# Patient Record
Sex: Female | Born: 1980
Health system: Southern US, Community
[De-identification: ages and names within clinical notes are randomized; demographics above are authoritative.]

## PROBLEM LIST (undated history)

## (undated) DIAGNOSIS — T8859XA Other complications of anesthesia, initial encounter: Secondary | ICD-10-CM

## (undated) DIAGNOSIS — Z87442 Personal history of urinary calculi: Secondary | ICD-10-CM

## (undated) DIAGNOSIS — J45909 Unspecified asthma, uncomplicated: Secondary | ICD-10-CM

## (undated) DIAGNOSIS — F4312 Post-traumatic stress disorder, chronic: Secondary | ICD-10-CM

## (undated) DIAGNOSIS — N1330 Unspecified hydronephrosis: Secondary | ICD-10-CM

## (undated) DIAGNOSIS — F329 Major depressive disorder, single episode, unspecified: Secondary | ICD-10-CM

## (undated) DIAGNOSIS — I10 Essential (primary) hypertension: Secondary | ICD-10-CM

## (undated) DIAGNOSIS — Z1379 Encounter for other screening for genetic and chromosomal anomalies: Secondary | ICD-10-CM

## (undated) DIAGNOSIS — T4145XA Adverse effect of unspecified anesthetic, initial encounter: Secondary | ICD-10-CM

## (undated) DIAGNOSIS — Z803 Family history of malignant neoplasm of breast: Secondary | ICD-10-CM

## (undated) DIAGNOSIS — F419 Anxiety disorder, unspecified: Secondary | ICD-10-CM

## (undated) DIAGNOSIS — K580 Irritable bowel syndrome with diarrhea: Secondary | ICD-10-CM

## (undated) DIAGNOSIS — D649 Anemia, unspecified: Secondary | ICD-10-CM

## (undated) DIAGNOSIS — N201 Calculus of ureter: Secondary | ICD-10-CM

## (undated) DIAGNOSIS — K219 Gastro-esophageal reflux disease without esophagitis: Secondary | ICD-10-CM

## (undated) DIAGNOSIS — N200 Calculus of kidney: Secondary | ICD-10-CM

## (undated) DIAGNOSIS — K5900 Constipation, unspecified: Secondary | ICD-10-CM

## (undated) DIAGNOSIS — T7840XA Allergy, unspecified, initial encounter: Secondary | ICD-10-CM

## (undated) HISTORY — DX: Anxiety disorder, unspecified: F41.9

## (undated) HISTORY — DX: Encounter for other screening for genetic and chromosomal anomalies: Z13.79

## (undated) HISTORY — DX: Allergy, unspecified, initial encounter: T78.40XA

## (undated) HISTORY — DX: Anemia, unspecified: D64.9

## (undated) HISTORY — PX: COLONOSCOPY: SHX174

## (undated) HISTORY — DX: Calculus of kidney: N20.0

## (undated) HISTORY — DX: Irritable bowel syndrome with diarrhea: K58.0

## (undated) HISTORY — DX: Essential (primary) hypertension: I10

## (undated) HISTORY — DX: Gastro-esophageal reflux disease without esophagitis: K21.9

## (undated) HISTORY — DX: Family history of malignant neoplasm of breast: Z80.3

---

## 2004-11-01 HISTORY — PX: LAPAROSCOPIC ENDOMETRIOSIS FULGURATION: SUR769

## 2006-04-08 ENCOUNTER — Emergency Department (HOSPITAL_COMMUNITY): Admission: EM | Admit: 2006-04-08 | Discharge: 2006-04-08 | Payer: Self-pay | Admitting: Emergency Medicine

## 2006-12-28 ENCOUNTER — Ambulatory Visit: Payer: Self-pay | Admitting: Unknown Physician Specialty

## 2007-01-05 ENCOUNTER — Ambulatory Visit: Payer: Self-pay | Admitting: Unknown Physician Specialty

## 2007-07-13 ENCOUNTER — Ambulatory Visit: Payer: Self-pay | Admitting: Nurse Practitioner

## 2010-11-01 HISTORY — PX: ESSURE TUBAL LIGATION: SUR464

## 2012-03-28 ENCOUNTER — Ambulatory Visit: Payer: Self-pay | Admitting: Nurse Practitioner

## 2013-12-02 DIAGNOSIS — Z1379 Encounter for other screening for genetic and chromosomal anomalies: Secondary | ICD-10-CM

## 2013-12-02 HISTORY — DX: Encounter for other screening for genetic and chromosomal anomalies: Z13.79

## 2014-10-17 DIAGNOSIS — E538 Deficiency of other specified B group vitamins: Secondary | ICD-10-CM | POA: Insufficient documentation

## 2015-07-18 ENCOUNTER — Encounter: Payer: Self-pay | Admitting: Psychiatry

## 2016-05-20 ENCOUNTER — Encounter: Payer: Self-pay | Admitting: Psychiatry

## 2016-05-20 ENCOUNTER — Ambulatory Visit (INDEPENDENT_AMBULATORY_CARE_PROVIDER_SITE_OTHER): Payer: BLUE CROSS/BLUE SHIELD | Admitting: Psychiatry

## 2016-05-20 VITALS — BP 129/84 | HR 83 | Temp 98.5°F | Ht 61.0 in | Wt 163.8 lb

## 2016-05-20 DIAGNOSIS — F331 Major depressive disorder, recurrent, moderate: Secondary | ICD-10-CM

## 2016-05-20 DIAGNOSIS — F4312 Post-traumatic stress disorder, chronic: Secondary | ICD-10-CM

## 2016-05-20 MED ORDER — ARIPIPRAZOLE 2 MG PO TABS: 2.0000 mg | ORAL_TABLET | Freq: Once | ORAL | Status: DC
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
## 2016-06-30 ENCOUNTER — Encounter: Payer: Self-pay | Admitting: Psychiatry

## 2016-08-23 ENCOUNTER — Encounter: Payer: Self-pay | Admitting: Psychiatry

## 2016-08-23 ENCOUNTER — Ambulatory Visit (INDEPENDENT_AMBULATORY_CARE_PROVIDER_SITE_OTHER): Payer: BLUE CROSS/BLUE SHIELD | Admitting: Psychiatry

## 2016-08-23 VITALS — BP 141/93 | HR 87 | Temp 98.4°F | Wt 179.8 lb

## 2016-08-23 DIAGNOSIS — F330 Major depressive disorder, recurrent, mild: Secondary | ICD-10-CM

## 2016-08-23 DIAGNOSIS — F4312 Post-traumatic stress disorder, chronic: Secondary | ICD-10-CM | POA: Diagnosis not present

## 2016-08-23 MED ORDER — DULOXETINE HCL 30 MG PO CPEP: 30.0000 mg | ORAL_CAPSULE | Freq: Every day | ORAL | 1 refills | Status: DC

## 2016-08-23 MED ORDER — ARIPIPRAZOLE 2 MG PO TABS: 2.0000 mg | ORAL_TABLET | Freq: Once | ORAL | 0 refills | Status: DC
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
## 2016-09-20 ENCOUNTER — Ambulatory Visit (INDEPENDENT_AMBULATORY_CARE_PROVIDER_SITE_OTHER): Payer: BLUE CROSS/BLUE SHIELD | Admitting: Psychiatry

## 2016-09-20 ENCOUNTER — Encounter: Payer: Self-pay | Admitting: Psychiatry

## 2016-09-20 VITALS — BP 110/76 | HR 78 | Wt 171.0 lb

## 2016-09-20 DIAGNOSIS — F4312 Post-traumatic stress disorder, chronic: Secondary | ICD-10-CM | POA: Diagnosis not present

## 2016-09-20 DIAGNOSIS — F331 Major depressive disorder, recurrent, moderate: Secondary | ICD-10-CM | POA: Diagnosis not present

## 2016-09-20 MED ORDER — ARIPIPRAZOLE 2 MG PO TABS
2.0000 mg | ORAL_TABLET | Freq: Once | ORAL | 0 refills | Status: DC
Start: 2016-09-20 — End: 2017-01-10

## 2016-09-20 MED ORDER — DULOXETINE HCL 30 MG PO CPEP: 30.0000 mg | ORAL_CAPSULE | Freq: Every day | ORAL | 1 refills | Status: DC
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
## 2016-09-22 ENCOUNTER — Other Ambulatory Visit: Payer: Self-pay | Admitting: Psychiatry

## 2016-12-15 ENCOUNTER — Ambulatory Visit: Payer: BLUE CROSS/BLUE SHIELD | Admitting: Psychiatry

## 2016-12-20 ENCOUNTER — Ambulatory Visit: Payer: BLUE CROSS/BLUE SHIELD | Admitting: Psychiatry

## 2017-01-10 ENCOUNTER — Ambulatory Visit: Payer: BLUE CROSS/BLUE SHIELD | Admitting: Psychiatry

## 2017-01-10 ENCOUNTER — Other Ambulatory Visit: Payer: Self-pay | Admitting: Psychiatry

## 2017-01-10 ENCOUNTER — Other Ambulatory Visit: Payer: Self-pay

## 2017-01-10 MED ORDER — DULOXETINE HCL 30 MG PO CPEP: 30.0000 mg | ORAL_CAPSULE | Freq: Every day | ORAL | 1 refills | Status: DC

## 2017-01-10 MED ORDER — ARIPIPRAZOLE 2 MG PO TABS: 2.0000 mg | ORAL_TABLET | Freq: Once | ORAL | 0 refills | Status: DC

## 2017-01-10 NOTE — Telephone Encounter (Signed)
Meds refilled x1 month.

## 2017-01-10 NOTE — Telephone Encounter (Signed)
pt called states that she canceled her appt today at 345 because of the weather.  pt got a new appt for 02-02-17.Marland Kitchen.  pt will need refills on medicaiton.

## 2017-02-02 ENCOUNTER — Ambulatory Visit (INDEPENDENT_AMBULATORY_CARE_PROVIDER_SITE_OTHER): Payer: BLUE CROSS/BLUE SHIELD | Admitting: Psychiatry

## 2017-02-02 ENCOUNTER — Encounter: Payer: Self-pay | Admitting: Psychiatry

## 2017-02-02 VITALS — BP 129/93 | HR 89 | Temp 98.8°F | Wt 165.0 lb

## 2017-02-02 DIAGNOSIS — F331 Major depressive disorder, recurrent, moderate: Secondary | ICD-10-CM | POA: Diagnosis not present

## 2017-02-02 MED ORDER — DULOXETINE HCL 30 MG PO CPEP: 30.0000 mg | ORAL_CAPSULE | Freq: Every day | ORAL | 1 refills | Status: DC

## 2017-02-02 MED ORDER — ARIPIPRAZOLE 2 MG PO TABS: 2.0000 mg | ORAL_TABLET | Freq: Once | ORAL | 1 refills | Status: DC
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
## 2017-03-16 ENCOUNTER — Encounter: Payer: Self-pay | Admitting: Obstetrics & Gynecology

## 2017-03-16 ENCOUNTER — Ambulatory Visit (INDEPENDENT_AMBULATORY_CARE_PROVIDER_SITE_OTHER): Payer: BLUE CROSS/BLUE SHIELD | Admitting: Obstetrics & Gynecology

## 2017-03-16 VITALS — BP 120/80 | HR 74 | Ht 60.0 in | Wt 172.0 lb

## 2017-03-16 DIAGNOSIS — Z124 Encounter for screening for malignant neoplasm of cervix: Secondary | ICD-10-CM | POA: Diagnosis not present

## 2017-03-16 DIAGNOSIS — Z0000 Encounter for general adult medical examination without abnormal findings: Secondary | ICD-10-CM | POA: Diagnosis not present

## 2017-03-16 MED ORDER — NORETHIN-ETH ESTRAD-FE BIPHAS 1 MG-10 MCG / 10 MCG PO TABS: 1.0000 | ORAL_TABLET | Freq: Every day | ORAL | 11 refills | Status: DC

## 2017-03-16 NOTE — Patient Instructions (Signed)
PAP every three years if normal today Labs yearly or so (with PCP)

## 2017-03-16 NOTE — Progress Notes (Signed)
HPI:      Carolyn Parker is a 36 y.o. No obstetric history on file. who LMP was Patient's last menstrual period was 02/26/2017., she presents today for her annual examination. The patient has no complaints today. The patient is sexually active. Her last pap: was normal. The patient does perform self breast exams.  There is notable family history of breast or ovarian cancer in her family.  The patient has regular exercise: yes.  The patient denies current symptoms of depression.   Pain on left from cyst at times H/o endometriosis, IBS OCP for mood/PMS control, only LoLo Estrin works  GYN History: Contraception: tubal ligation  PMHx: Past Medical History:  Diagnosis Date  . Anxiety   . Depression    Past Surgical History:  Procedure Laterality Date  . LAPAROSCOPIC ENDOMETRIOSIS FULGURATION     Family History  Problem Relation Age of Onset  . Anxiety disorder Mother   . Depression Mother   . Diabetes Mother   . Heart Problems Mother   . Hypertension Father   . Bipolar disorder Sister    Social History  Substance Use Topics  . Smoking status: Former Smoker    Types: Cigarettes    Start date: 05/20/2000    Quit date: 05/20/2010  . Smokeless tobacco: Never Used  . Alcohol use No     Comment: social     Current Outpatient Prescriptions:  .  albuterol (PROAIR HFA) 108 (90 Base) MCG/ACT inhaler, , Disp: , Rfl:  .  Cholecalciferol (VITAMIN D-1000 MAX ST) 1000 units tablet, Take by mouth., Disp: , Rfl:  .  Cyanocobalamin (RA VITAMIN B-12 TR) 1000 MCG TBCR, Take by mouth., Disp: , Rfl:  .  DULoxetine (CYMBALTA) 30 MG capsule, Take 1 capsule (30 mg total) by mouth daily., Disp: 30 capsule, Rfl: 1 .  montelukast (SINGULAIR) 10 MG tablet, Take by mouth., Disp: , Rfl:  .  norethindrone-ethinyl estradiol-iron (MICROGESTIN FE,GILDESS FE,LOESTRIN FE) 1.5-30 MG-MCG tablet, Take by mouth., Disp: , Rfl:  .  ARIPiprazole (ABILIFY) 2 MG tablet, Take 1 tablet (2 mg total) by mouth once.,  Disp: 30 tablet, Rfl: 1 .  fluticasone (FLONASE) 50 MCG/ACT nasal spray, , Disp: , Rfl: 0 .  montelukast (SINGULAIR) 10 MG tablet, Take by mouth., Disp: , Rfl:  .  Norethindrone-Ethinyl Estradiol-Fe Biphas (LO LOESTRIN FE) 1 MG-10 MCG / 10 MCG tablet, Take 1 tablet by mouth daily., Disp: 1 Package, Rfl: 11 .  predniSONE (DELTASONE) 10 MG tablet, , Disp: , Rfl: 0 Allergies: Bee venom; Iodinated diagnostic agents; Amoxicillin-pot clavulanate; and Sertraline  Review of Systems  Constitutional: Negative for chills, fever and malaise/fatigue.  HENT: Negative for congestion, sinus pain and sore throat.   Eyes: Negative for blurred vision and pain.  Respiratory: Negative for cough and wheezing.   Cardiovascular: Negative for chest pain and leg swelling.  Gastrointestinal: Negative for abdominal pain, constipation, diarrhea, heartburn, nausea and vomiting.  Genitourinary: Negative for dysuria, frequency, hematuria and urgency.  Musculoskeletal: Negative for back pain, joint pain, myalgias and neck pain.  Skin: Negative for itching and rash.  Neurological: Negative for dizziness, tremors and weakness.  Endo/Heme/Allergies: Does not bruise/bleed easily.  Psychiatric/Behavioral: Negative for depression. The patient is not nervous/anxious and does not have insomnia.     Objective: BP 120/80   Pulse 74   Ht 5' (1.524 m)   Wt 172 lb (78 kg)   LMP 02/26/2017   BMI 33.59 kg/m   [employer]  03/16/17 1025  Weight: 172 lb (78 kg)   Body mass index is 33.59 kg/m. Physical Exam  Constitutional: She is oriented to person, place, and time. She appears well-developed and well-nourished. No distress.  Genitourinary: Rectum normal, vagina normal and uterus normal. Pelvic exam was performed with patient supine. There is no rash or lesion on the right labia. There is no rash or lesion on the left labia. Vagina exhibits no lesion. No bleeding in the vagina. Right adnexum does not display mass and does  not display tenderness. Left adnexum does not display mass and does not display tenderness. Cervix does not exhibit motion tenderness, lesion, friability or polyp.   Uterus is mobile and midaxial. Uterus is not enlarged or exhibiting a mass.  HENT:  Head: Normocephalic and atraumatic. Head is without laceration.  Right Ear: Hearing normal.  Left Ear: Hearing normal.  Nose: No epistaxis.  No foreign bodies.  Mouth/Throat: Uvula is midline, oropharynx is clear and moist and mucous membranes are normal.  Eyes: Pupils are equal, round, and reactive to light.  Neck: Normal range of motion. Neck supple. No thyromegaly present.  Cardiovascular: Normal rate and regular rhythm.  Exam reveals no gallop and no friction rub.   No murmur heard. Pulmonary/Chest: Effort normal and breath sounds normal. No respiratory distress. She has no wheezes. Right breast exhibits no mass, no skin change and no tenderness. Left breast exhibits no mass, no skin change and no tenderness.  Abdominal: Soft. Bowel sounds are normal. She exhibits no distension. There is no tenderness. There is no rebound.  Musculoskeletal: Normal range of motion.  Neurological: She is alert and oriented to person, place, and time. No cranial nerve deficit.  Skin: Skin is warm and dry.  Psychiatric: She has a normal mood and affect. Judgment normal.  Vitals reviewed.   Assessment:  ANNUAL EXAM 1. Annual physical exam   2. Screening for cervical cancer      Screening Plan:            1.  Cervical Screening-  Pap smear done today  2. Breast screening- Exam annually and mammogram>40 planned   3. Colonoscopy every 10 years, Hemoccult testing - after age 48  4. Labs managed by PCP  5. Counseling for contraception: bilateral tubal ligation  Other:  1. Annual physical exam  2. Screening for cervical cancer - IGP, Aptima HPV  Cont OCP for mood/PMS control Monitor for ovarian cyst pain (usu Left sided)    F/U  Return in about  1 year (around 03/16/2018) for Annual.  [name], MD, [facility], [facility] 03/16/2017  11:02 AM

## 2017-03-21 LAB — IGP, APTIMA HPV
HPV APTIMA: NEGATIVE
PAP SMEAR COMMENT: 0

## 2017-03-22 ENCOUNTER — Encounter: Payer: Self-pay | Admitting: Obstetrics & Gynecology

## 2017-03-23 ENCOUNTER — Other Ambulatory Visit: Payer: Self-pay | Admitting: Psychiatry

## 2017-03-23 ENCOUNTER — Encounter: Payer: Self-pay | Admitting: Obstetrics and Gynecology

## 2017-04-04 ENCOUNTER — Ambulatory Visit (INDEPENDENT_AMBULATORY_CARE_PROVIDER_SITE_OTHER): Payer: BLUE CROSS/BLUE SHIELD | Admitting: Psychiatry

## 2017-04-04 ENCOUNTER — Encounter: Payer: Self-pay | Admitting: Psychiatry

## 2017-04-04 VITALS — BP 120/84 | HR 94 | Temp 98.8°F | Wt 175.4 lb

## 2017-04-04 DIAGNOSIS — F330 Major depressive disorder, recurrent, mild: Secondary | ICD-10-CM

## 2017-04-04 MED ORDER — ARIPIPRAZOLE 2 MG PO TABS: 2.0000 mg | ORAL_TABLET | Freq: Once | ORAL | 2 refills | Status: DC

## 2017-04-04 MED ORDER — DULOXETINE HCL 30 MG PO CPEP: 30.0000 mg | ORAL_CAPSULE | Freq: Every day | ORAL | 2 refills | Status: DC
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
## 2017-05-09 ENCOUNTER — Telehealth: Payer: Self-pay | Admitting: Obstetrics & Gynecology

## 2017-05-09 NOTE — Telephone Encounter (Signed)
I will start the PA today and let pt know

## 2017-05-09 NOTE — Telephone Encounter (Signed)
Left message for pt to call back, [facility] that she did not tolerate

## 2017-05-09 NOTE — Telephone Encounter (Signed)
Patient came by office today- wanting to speak to someone about getting prior auth for medication Lo Loestren FE so she can afford to get the medication.

## 2017-05-10 NOTE — Telephone Encounter (Signed)
Called pt. No answer °

## 2017-06-27 ENCOUNTER — Ambulatory Visit: Payer: BLUE CROSS/BLUE SHIELD | Admitting: Psychiatry

## 2017-06-27 ENCOUNTER — Ambulatory Visit (INDEPENDENT_AMBULATORY_CARE_PROVIDER_SITE_OTHER): Payer: BLUE CROSS/BLUE SHIELD | Admitting: Psychiatry

## 2017-06-27 ENCOUNTER — Encounter: Payer: Self-pay | Admitting: Psychiatry

## 2017-06-27 VITALS — BP 133/87 | HR 84 | Temp 98.9°F | Wt 178.2 lb

## 2017-06-27 DIAGNOSIS — F330 Major depressive disorder, recurrent, mild: Secondary | ICD-10-CM | POA: Diagnosis not present

## 2017-06-27 MED ORDER — ARIPIPRAZOLE 2 MG PO TABS: 2.0000 mg | ORAL_TABLET | Freq: Once | ORAL | 2 refills | Status: DC

## 2017-06-27 MED ORDER — DULOXETINE HCL 30 MG PO CPEP: 30.0000 mg | ORAL_CAPSULE | Freq: Every day | ORAL | 2 refills | Status: DC
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
## 2017-09-12 ENCOUNTER — Ambulatory Visit: Payer: BLUE CROSS/BLUE SHIELD | Admitting: Psychiatry

## 2017-09-12 ENCOUNTER — Encounter: Payer: Self-pay | Admitting: Psychiatry

## 2017-09-12 VITALS — BP 132/90 | HR 77 | Ht 60.0 in | Wt 167.0 lb

## 2017-09-12 DIAGNOSIS — F331 Major depressive disorder, recurrent, moderate: Secondary | ICD-10-CM

## 2017-09-12 DIAGNOSIS — F4312 Post-traumatic stress disorder, chronic: Secondary | ICD-10-CM | POA: Diagnosis not present

## 2017-09-12 MED ORDER — DULOXETINE HCL 30 MG PO CPEP: 30.0000 mg | ORAL_CAPSULE | Freq: Every day | ORAL | 2 refills | Status: DC

## 2017-09-12 MED ORDER — ARIPIPRAZOLE 2 MG PO TABS: 2.0000 mg | ORAL_TABLET | Freq: Once | ORAL | 2 refills | Status: DC
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
## 2017-09-30 ENCOUNTER — Encounter: Payer: Self-pay | Admitting: Gastroenterology

## 2017-09-30 ENCOUNTER — Other Ambulatory Visit (INDEPENDENT_AMBULATORY_CARE_PROVIDER_SITE_OTHER): Payer: BLUE CROSS/BLUE SHIELD

## 2017-09-30 ENCOUNTER — Ambulatory Visit: Payer: BLUE CROSS/BLUE SHIELD | Admitting: Gastroenterology

## 2017-09-30 VITALS — BP 116/80 | HR 80 | Ht 60.75 in | Wt 159.0 lb

## 2017-09-30 DIAGNOSIS — R194 Change in bowel habit: Secondary | ICD-10-CM

## 2017-09-30 DIAGNOSIS — R1084 Generalized abdominal pain: Secondary | ICD-10-CM

## 2017-09-30 DIAGNOSIS — R634 Abnormal weight loss: Secondary | ICD-10-CM | POA: Diagnosis not present

## 2017-09-30 LAB — TSH: TSH: 1.57 u[IU]/mL (ref 0.35–4.50)

## 2017-09-30 LAB — IGA: IGA: 160 mg/dL (ref 68–378)

## 2017-09-30 MED ORDER — NA SULFATE-K SULFATE-MG SULF 17.5-3.13-1.6 GM/177ML PO SOLN: ORAL | 0 refills | Status: DC

## 2017-09-30 NOTE — Progress Notes (Addendum)
09/30/2017 Carolyn Parker [id number] 01/11/1981   HISTORY OF PRESENT ILLNESS:  Carolyn Parker is a 36 year old female who is new to our practice.  She was referred here by Carolyn Geraldammy McKinney, NP, for evaluation regarding change in bowel habits, abdominal pain, and weight loss with poor appetite.  She tells me that she's had GI issues forever.  Never had previous GI evaluation, was just told that she probably had IBS.  Tells me that she has seen and allergist and is apparently allergic to several foods including wheat, beef, pork, dairy, nuts, and shellfish; apparently manifests GI symptoms.  Says that she previously always had constipation with only occasional diarrhea.  Says that she recently went through a period about 3-4 weeks ago where she was having a lot of diarrhea.  Now back to more constipation again.  Complains of abdominal cramping, mostly in the LLQ.  Says that her appetite has been terrible and she has lost 25 pounds in about 3 weeks.  Feels like her appetite is coming back again slightly over the past couple of weeks.  Complains of nausea daily when she wakes up in the morning and then throughout the day as well.  Says that she is under a lot of stress as a mom, wife, and full-time Consulting civil engineerstudent.  CBC and BMP normal.  Stool for giardia and crytosporidium were negative.   Past Medical History:  Diagnosis Date  . Anxiety   . Depression   . Diverticulitis   . Diverticulosis   . Family history of breast cancer   . Genetic testing 12/2013   My Risk neg    Past Surgical History:  Procedure Laterality Date  . LAPAROSCOPIC ENDOMETRIOSIS FULGURATION      reports that she quit smoking about 7 years ago. Her smoking use included cigarettes. She started smoking about 17 years ago. she has never used smokeless tobacco. She reports that she does not drink alcohol or use drugs. family history includes Anxiety disorder in her mother; Bipolar disorder in her sister; Breast cancer (age of onset: 530) in her  maternal aunt; Colon cancer (age of onset: 6855) in her mother; Colon cancer (age of onset: 7280) in her maternal grandmother; Depression in her mother; Diabetes in her mother; Heart Problems in her mother; Hypertension in her father. Allergies  Allergen Reactions  . Bee Venom Anaphylaxis    Uncoded Allergy. Allergen: bee sting  . Iodinated Diagnostic Agents Anaphylaxis    Uncoded Allergy. Allergen: bee sting  . Amoxicillin-Pot Clavulanate Nausea And Vomiting    Other reaction(s): Vomiting  . Sertraline Other (See Comments)    Other reaction(s): Other (See Comments) Other Reaction: Other reaction-serotonin syn Other Reaction: Other reaction-serotonin syn      Outpatient Encounter Medications as of 09/30/2017  Medication Sig  . albuterol (PROAIR HFA) 108 (90 Base) MCG/ACT inhaler   . amoxicillin-clavulanate (AUGMENTIN) 875-125 MG tablet Take 1 tablet by mouth every 12 (twelve) hours. For 10 days  . ARIPiprazole (ABILIFY) 2 MG tablet Take 1 tablet (2 mg total) once for 1 dose by mouth.  . DULoxetine (CYMBALTA) 30 MG capsule Take 1 capsule (30 mg total) daily by mouth.  . FIBER PO Take by mouth. .25-.50 of teaspoon twice a day  . Probiotic Product (PROBIOTIC PO) Take 1 capsule by mouth daily.   No facility-administered encounter medications on file as of 09/30/2017.      REVIEW OF SYSTEMS  : All other systems reviewed and negative except where noted in  the History of Present Illness.   PHYSICAL EXAM: There were no vitals taken for this visit. General: Well developed white female in no acute distress Head: Normocephalic and atraumatic Eyes:  Sclerae anicteric, conjunctiva pink. Ears: Normal auditory acuity Lungs: Clear throughout to auscultation; no increased WOB. Heart: Regular rate and rhythm; no M/R/G. Abdomen: Soft, non-distended.  BS present.  Mild diffuse TTP, somewhat > on the left. Rectal:  Will be done at the time of colonoscopy. Musculoskeletal: Symmetrical with no gross  deformities  Skin: No lesions on visible extremities Extremities: No edema  Neurological: Alert oriented x 4, grossly non-focal Psychological:  Alert and cooperative. Normal mood and affect  ASSESSMENT AND PLAN: *36 year old female with long-standing GI symptoms, no previous GI evaluation, presenting with several complaints including diarrhea alternating with constipation, abdominal pain, loss of appetite, weight loss, and nausea.  Apparently has several food "allergies" with which she manifests GI symptoms.  Will check labs including TSH and celiac studies.  Will schedule for colonoscopy with Dr. Rhea BeltonPyrtle.  Will continue daily probiotic and will switch to a daily powder fiber supplement such as Benefiber or Citrucel.  **The risks, benefits, and alternatives to colonoscopy were discussed with the patient and she consents to proceed.    CC:  Carolyn GeraldMcKinney, [name], Parker  Addendum: Reviewed and agree with initial management. Pyrtle, Carie CaddyJay M, MD

## 2017-09-30 NOTE — Patient Instructions (Signed)
If you are age 765 or older, your body mass index should be between 23-30. Your Body mass index is 30.29 kg/m. If this is out of the aforementioned range listed, please consider follow up with your Primary Care Provider.  If you are age 34 or younger, your body mass index should be between 19-25. Your Body mass index is 30.29 kg/m. If this is out of the aformentioned range listed, please consider follow up with your Primary Care Provider.   You have been scheduled for a colonoscopy. Please follow written instructions given to you at your visit today.  Please pick up your prep supplies at the pharmacy within the next 1-3 days. If you use inhalers (even only as needed), please bring them with you on the day of your procedure. Your physician has requested that you go to www.startemmi.com and enter the access code given to you at your visit today. This web site gives a general overview about your procedure. However, you should still follow specific instructions given to you by our office regarding your preparation for the procedure.  We have sent the following medications to your pharmacy for you to pick up at your convenience: Suprep  Your physician has requested that you go to the basement for the following lab work before leaving today: TSH IGA TTG  Thank you for choosing me and [facility].   Doug SouJessica Zehr, PA-C

## 2017-10-03 ENCOUNTER — Telehealth: Payer: Self-pay | Admitting: Gastroenterology

## 2017-10-03 LAB — TISSUE TRANSGLUTAMINASE ABS,IGG,IGA
(TTG) AB, IGA: 1 U/mL
(tTG) Ab, IgG: 2 U/mL

## 2017-10-03 NOTE — Telephone Encounter (Signed)
Pharmacy informed that we will give the pt a sample.

## 2017-10-03 NOTE — Telephone Encounter (Signed)
Called the patient and left a message on voicemail (home and cell) for her to return call. Will put Suprep sample at front desk for her to pick up.

## 2017-10-05 ENCOUNTER — Encounter: Payer: Self-pay | Admitting: Gastroenterology

## 2017-10-05 DIAGNOSIS — R194 Change in bowel habit: Secondary | ICD-10-CM | POA: Insufficient documentation

## 2017-10-05 DIAGNOSIS — R634 Abnormal weight loss: Secondary | ICD-10-CM | POA: Insufficient documentation

## 2017-10-05 DIAGNOSIS — R1084 Generalized abdominal pain: Secondary | ICD-10-CM | POA: Insufficient documentation

## 2017-10-06 ENCOUNTER — Other Ambulatory Visit: Payer: Self-pay

## 2017-10-06 ENCOUNTER — Encounter: Payer: Self-pay | Admitting: Internal Medicine

## 2017-10-06 ENCOUNTER — Ambulatory Visit (AMBULATORY_SURGERY_CENTER): Payer: BLUE CROSS/BLUE SHIELD | Admitting: Internal Medicine

## 2017-10-06 ENCOUNTER — Telehealth: Payer: Self-pay | Admitting: *Deleted

## 2017-10-06 VITALS — BP 140/96 | HR 89 | Temp 99.1°F | Resp 14 | Ht 63.75 in | Wt 159.0 lb

## 2017-10-06 DIAGNOSIS — R197 Diarrhea, unspecified: Secondary | ICD-10-CM | POA: Diagnosis not present

## 2017-10-06 DIAGNOSIS — R194 Change in bowel habit: Secondary | ICD-10-CM | POA: Diagnosis present

## 2017-10-06 MED ORDER — SODIUM CHLORIDE 0.9 % IV SOLN: 500.0000 mL | Freq: Once | INTRAVENOUS | Status: DC

## 2017-10-06 NOTE — Telephone Encounter (Signed)
It might still be possible for her to have procedure today, since she is scheduled for late afternoon. It would have to be like this:  Take first dose of prep now with the water afterward as instructed.  Take second dose of prep at 11AM with the water as well.  If she is unable to tolerate that or her stool is not running clear/pale yellow by the time she is supposed to leave for the [facility], call back . Dr. Rhea BeltonPyrtle will be available early afternoon.

## 2017-10-06 NOTE — Telephone Encounter (Signed)
Pt states she still wants to try to have procedure done.  She has Phenergan at home and I told her to take a Phenergan now, wait about 20 minutes and try drinking her prep again.  She will call back at noon with an update on how she is doing.

## 2017-10-06 NOTE — Telephone Encounter (Signed)
Patient called back in stating that she is throwing up the prep. Best # [phone]

## 2017-10-06 NOTE — Progress Notes (Signed)
Called to room to assist during endoscopic procedure.  Patient ID and intended procedure confirmed with present staff. Received instructions for my participation in the procedure from the performing physician.  

## 2017-10-06 NOTE — Telephone Encounter (Signed)
Returned pts phone call and gave her instructions to start and complete the prep this morning.  She knows to call back if she is unable to tolerate or if stools are not running clear.

## 2017-10-06 NOTE — Progress Notes (Signed)
Patient oxygen level dropped to the 80s o2 applied to increase level. Oxygen level increased to 96. Patient alert and talking. Skin pink. Stated I feel so much better. Dr. Rhea BeltonPyrtle notified of change in status.

## 2017-10-06 NOTE — Progress Notes (Signed)
Report given to PACU, vss 

## 2017-10-06 NOTE — Op Note (Addendum)
[facility] Patient Name: Carolyn Parker Procedure Date: 10/06/2017 3:31 PM MRN: [id number] Endoscopist: Beverley Fiedler , MD Age: 36 Referring MD:  Date of Birth: 1981-06-27 Gender: Female Account #: [id number] Procedure:                Colonoscopy Indications:              Clinically significant diarrhea of unexplained                            origin, Change in bowel habits, Weight loss Medicines:                Monitored Anesthesia Care Procedure:                Pre-Anesthesia Assessment:                           - Prior to the procedure, a History and Physical                            was performed, and patient medications and                            allergies were reviewed. The patient's tolerance of                            previous anesthesia was also reviewed. The risks                            and benefits of the procedure and the sedation                            options and risks were discussed with the patient.                            All questions were answered, and informed consent                            was obtained. Prior Anticoagulants: The patient has                            taken no previous anticoagulant or antiplatelet                            agents. ASA Grade Assessment: II - A patient with                            mild systemic disease. After reviewing the risks                            and benefits, the patient was deemed in                            satisfactory condition to undergo the procedure.  After obtaining informed consent, the colonoscope                            was passed under direct vision. Throughout the                            procedure, the patient's blood pressure, pulse, and                            oxygen saturations were monitored continuously. The                            Colonoscope was introduced through the anus and                            advanced to the the  terminal ileum. The colonoscopy                            was performed without difficulty. The patient                            tolerated the procedure well. The quality of the                            bowel preparation was excellent. The terminal                            ileum, ileocecal valve, appendiceal orifice, and                            rectum were photographed. Scope In: 3:48:34 PM Scope Out: 4:00:13 PM Scope Withdrawal Time: 0 hours 7 minutes 50 seconds  Total Procedure Duration: 0 hours 11 minutes 39 seconds  Findings:                 The digital rectal exam was normal.                           The terminal ileum appeared normal.                           The colon (entire examined portion) appeared                            normal. Biopsies for histology were taken with a                            cold forceps from the right colon and left colon                            for evaluation of microscopic colitis.                           The retroflexed view of the distal rectum and anal  verge was normal and showed no anal or rectal                            abnormalities. Complications:            No immediate complications. Estimated Blood Loss:     Estimated blood loss was minimal. Impression:               - The examined portion of the ileum was normal.                           - The entire examined colon is normal. Biopsied.                           - The distal rectum and anal verge are normal on                            retroflexion view. Recommendation:           - Patient has a contact number available for                            emergencies. The signs and symptoms of potential                            delayed complications were discussed with the                            patient. Return to normal activities tomorrow.                            Written discharge instructions were provided to the                             patient.                           - Resume previous diet.                           - Continue present medications.                           - Await pathology results. If negative would pursue                            CBC, CMP, CRP and consider trial of rifaximin 550                            mg TID x 14 days for IBS-D.                           - Repeat colonoscopy at age 36 for screening                            purposes given family history of colon  cancer. Beverley FiedlerJay M Jimmy Stipes, MD 10/06/2017 4:10:19 PM This report has been signed electronically.

## 2017-10-06 NOTE — Telephone Encounter (Signed)
Patient states she just finished first round of trip and wanted to check before starting second part. Best call back # [phone]

## 2017-10-06 NOTE — Telephone Encounter (Signed)
Pt called this am.  States that before she was to start taking prep last night she started having nausea and diarrhea.  She called on call physician and was told to take the promethazine that she had at home.  She took this and it helped but she fell asleep and did not take prep last night.  She feels like she can take the 2nd half this morning.  Please advise if she should proceed and if she should take any additional prep.

## 2017-10-06 NOTE — Telephone Encounter (Signed)
Pt states she will drink the second dose of her prep now and will be NPO after 1.

## 2017-10-06 NOTE — Patient Instructions (Signed)
Discharge instructions given. Biopsies taken. Resume previous medications. YOU HAD AN ENDOSCOPIC PROCEDURE TODAY AT THE [facility]:   Refer to the procedure report that was given to you for any specific questions about what was found during the examination.  If the procedure report does not answer your questions, please call your gastroenterologist to clarify.  If you requested that your care partner not be given the details of your procedure findings, then the procedure report has been included in a sealed envelope for you to review at your convenience later.  YOU SHOULD EXPECT: Some feelings of bloating in the abdomen. Passage of more gas than usual.  Walking can help get rid of the air that was put into your GI tract during the procedure and reduce the bloating. If you had a lower endoscopy (such as a colonoscopy or flexible sigmoidoscopy) you may notice spotting of blood in your stool or on the toilet paper. If you underwent a bowel prep for your procedure, you may not have a normal bowel movement for a few days.  Please Note:  You might notice some irritation and congestion in your nose or some drainage.  This is from the oxygen used during your procedure.  There is no need for concern and it should clear up in a day or so.  SYMPTOMS TO REPORT IMMEDIATELY:   Following lower endoscopy (colonoscopy or flexible sigmoidoscopy):  Excessive amounts of blood in the stool  Significant tenderness or worsening of abdominal pains  Swelling of the abdomen that is new, acute  Fever of 100F or higher   For urgent or emergent issues, a gastroenterologist can be reached at any hour by calling [phone].   DIET:  We do recommend a small meal at first, but then you may proceed to your regular diet.  Drink plenty of fluids but you should avoid alcoholic beverages for 24 hours.  ACTIVITY:  You should plan to take it easy for the rest of today and you should NOT DRIVE or use heavy  machinery until tomorrow (because of the sedation medicines used during the test).    FOLLOW UP: Our staff will call the number listed on your records the next business day following your procedure to check on you and address any questions or concerns that you may have regarding the information given to you following your procedure. If we do not reach you, we will leave a message.  However, if you are feeling well and you are not experiencing any problems, there is no need to return our call.  We will assume that you have returned to your regular daily activities without incident.  If any biopsies were taken you will be contacted by phone or by letter within the next 1-3 weeks.  Please call us at [phone] if you have not heard about the biopsies in 3 weeks.    SIGNATURES/CONFIDENTIALITY: You and/or your care partner have signed paperwork which will be entered into your electronic medical record.  These signatures attest to the fact that that the information above on your After Visit Summary has been reviewed and is understood.  Full responsibility of the confidentiality of this discharge information lies with you and/or your care-partner. 

## 2017-10-06 NOTE — Progress Notes (Signed)
Pt's states no medical or surgical changes since previsit or office visit. 

## 2017-10-07 ENCOUNTER — Telehealth: Payer: Self-pay | Admitting: *Deleted

## 2017-10-07 NOTE — Telephone Encounter (Signed)
  Follow up Call-  Call back number 10/06/2017  Post procedure Call Back phone  # [phone]  Permission to leave phone message Yes  Some recent data might be hidden     Patient questions:  Do you have a fever, pain , or abdominal swelling? No. Pain Score  0 *  Have you tolerated food without any problems? Yes.    Have you been able to return to your normal activities? Yes.    Do you have any questions about your discharge instructions: Diet   No. Medications  No. Follow up visit  No.  Do you have questions or concerns about your Care? No.  Actions: * If pain score is 4 or above: No action needed, pain <4.

## 2017-10-12 ENCOUNTER — Other Ambulatory Visit: Payer: Self-pay

## 2017-10-12 DIAGNOSIS — K529 Noninfective gastroenteritis and colitis, unspecified: Secondary | ICD-10-CM

## 2017-10-12 MED ORDER — RIFAXIMIN 550 MG PO TABS: 550.0000 mg | ORAL_TABLET | Freq: Three times a day (TID) | ORAL | 0 refills | Status: DC

## 2017-10-20 ENCOUNTER — Other Ambulatory Visit (INDEPENDENT_AMBULATORY_CARE_PROVIDER_SITE_OTHER): Payer: BLUE CROSS/BLUE SHIELD

## 2017-10-20 DIAGNOSIS — K529 Noninfective gastroenteritis and colitis, unspecified: Secondary | ICD-10-CM

## 2017-10-20 LAB — COMPREHENSIVE METABOLIC PANEL
ALBUMIN: 4.5 g/dL (ref 3.5–5.2)
ALT: 17 U/L (ref 0–35)
AST: 14 U/L (ref 0–37)
Alkaline Phosphatase: 68 U/L (ref 39–117)
BUN: 13 mg/dL (ref 6–23)
CALCIUM: 9.2 mg/dL (ref 8.4–10.5)
CHLORIDE: 104 meq/L (ref 96–112)
CO2: 29 meq/L (ref 19–32)
Creatinine, Ser: 0.82 mg/dL (ref 0.40–1.20)
GFR: 84.71 mL/min (ref >60.00)
Glucose, Bld: 89 mg/dL (ref 70–99)
POTASSIUM: 4 meq/L (ref 3.5–5.1)
Sodium: 141 mEq/L (ref 135–145)
Total Bilirubin: 0.4 mg/dL (ref 0.2–1.2)
Total Protein: 7.6 g/dL (ref 6.0–8.3)

## 2017-10-20 LAB — CBC WITH DIFFERENTIAL/PLATELET
BASOS PCT: 1 % (ref 0.0–3.0)
Basophils Absolute: 0.1 10*3/uL (ref 0.0–0.1)
EOS PCT: 2.9 % (ref 0.0–5.0)
Eosinophils Absolute: 0.2 10*3/uL (ref 0.0–0.7)
HEMATOCRIT: 42 % (ref 36.0–46.0)
HEMOGLOBIN: 13.5 g/dL (ref 12.0–15.0)
LYMPHS PCT: 39.9 % (ref 12.0–46.0)
Lymphs Abs: 3 10*3/uL (ref 0.7–4.0)
MCHC: 32.1 g/dL (ref 30.0–36.0)
MCV: 85.9 fl (ref 78.0–100.0)
MONOS PCT: 6 % (ref 3.0–12.0)
Monocytes Absolute: 0.5 10*3/uL (ref 0.1–1.0)
Neutro Abs: 3.8 10*3/uL (ref 1.4–7.7)
Neutrophils Relative %: 50.2 % (ref 43.0–77.0)
Platelets: 292 10*3/uL (ref 150.0–400.0)
RBC: 4.89 Mil/uL (ref 3.87–5.11)
RDW: 13.4 % (ref 11.5–15.5)
WBC: 7.6 10*3/uL (ref 4.0–10.5)

## 2017-10-20 LAB — C-REACTIVE PROTEIN: CRP: 0.8 mg/dL (ref 0.5–20.0)

## 2017-12-05 ENCOUNTER — Other Ambulatory Visit: Payer: Self-pay

## 2017-12-05 ENCOUNTER — Telehealth: Payer: Self-pay | Admitting: Internal Medicine

## 2017-12-05 MED ORDER — DIPHENOXYLATE-ATROPINE 2.5-0.025 MG PO TABS: 1.0000 | ORAL_TABLET | Freq: Four times a day (QID) | ORAL | 2 refills | Status: DC | PRN

## 2017-12-05 NOTE — Telephone Encounter (Signed)
I would recommend that she follow-up with me in the office to discuss other therapies for IBS D such as Viberzi or Lotronex While waiting on appointment she can use Lomotil 1 tablet 4 times daily as needed for diarrhea and to help control loose stools

## 2017-12-05 NOTE — Telephone Encounter (Signed)
Patient states she was put on medication xifaxan after procedure on 12.6.18 but it is not working and she wants to know what she can do.

## 2017-12-05 NOTE — Telephone Encounter (Signed)
Pt aware and scheduled to see Dr. Rhea BeltonPyrtle 02/06/18@1 :45pm. Script sent to pharmacy.

## 2017-12-05 NOTE — Telephone Encounter (Signed)
Pt had colon in December and was placed on xifaxan for 14 days. States this helped her symptoms for about a week but now she is having diarrhea and nausea now. She states last night she had diarrhea in her sleep and didn't even know she had it. Patient wants to know what else she can try. Please advise.

## 2017-12-07 ENCOUNTER — Telehealth: Payer: Self-pay | Admitting: Internal Medicine

## 2017-12-07 NOTE — Telephone Encounter (Signed)
Left voicemail advising patient that we have never prescribed promethazine for her. Therefore, she will need to get promethazine from her PCP or whomever previously prescribed her medication.

## 2017-12-12 ENCOUNTER — Encounter: Payer: Self-pay | Admitting: Psychiatry

## 2017-12-12 ENCOUNTER — Other Ambulatory Visit: Payer: Self-pay

## 2017-12-12 ENCOUNTER — Ambulatory Visit: Payer: BLUE CROSS/BLUE SHIELD | Admitting: Psychiatry

## 2017-12-12 VITALS — BP 126/92 | HR 102 | Temp 98.5°F | Wt 153.0 lb

## 2017-12-12 DIAGNOSIS — F4312 Post-traumatic stress disorder, chronic: Secondary | ICD-10-CM | POA: Diagnosis not present

## 2017-12-12 DIAGNOSIS — F331 Major depressive disorder, recurrent, moderate: Secondary | ICD-10-CM

## 2017-12-12 MED ORDER — DULOXETINE HCL 30 MG PO CPEP: 30.0000 mg | ORAL_CAPSULE | Freq: Every day | ORAL | 2 refills | Status: DC

## 2017-12-12 MED ORDER — ARIPIPRAZOLE 2 MG PO TABS: 2.0000 mg | ORAL_TABLET | Freq: Once | ORAL | 2 refills | Status: DC
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
## 2017-12-14 ENCOUNTER — Telehealth: Payer: Self-pay

## 2017-12-14 NOTE — Telephone Encounter (Signed)
Pt in a lot of lower abdominal pain on the left.  Ibuprofen, tylenol, aspirin - nothing helps c the pain.  [phone]  Spoke c hsb 4:40pm, on his way c pt to the ED, not sure if she will be able to keep appt tomorrow.  Pt saw PCP, blood in urine, they told her to go to ED.

## 2017-12-15 ENCOUNTER — Ambulatory Visit: Payer: BLUE CROSS/BLUE SHIELD | Admitting: Obstetrics & Gynecology

## 2017-12-22 ENCOUNTER — Other Ambulatory Visit: Payer: Self-pay | Admitting: Urology

## 2017-12-22 ENCOUNTER — Encounter (HOSPITAL_COMMUNITY): Payer: Self-pay | Admitting: General Practice

## 2017-12-23 ENCOUNTER — Encounter (HOSPITAL_COMMUNITY): Payer: Self-pay | Admitting: General Practice

## 2017-12-26 ENCOUNTER — Ambulatory Visit (HOSPITAL_COMMUNITY): Payer: BLUE CROSS/BLUE SHIELD

## 2017-12-26 ENCOUNTER — Encounter (HOSPITAL_COMMUNITY)
Admission: RE | Disposition: A | Discharge status: Home / Self Care | Payer: Self-pay | Source: Ambulatory Visit | Attending: Urology

## 2017-12-26 ENCOUNTER — Ambulatory Visit (HOSPITAL_COMMUNITY)
Admission: RE | Admit: 2017-12-26 | Discharge: 2017-12-26 | Disposition: A | Discharge status: Home / Self Care | Payer: BLUE CROSS/BLUE SHIELD | Source: Ambulatory Visit | Attending: Urology | Admitting: Urology

## 2017-12-26 ENCOUNTER — Encounter (HOSPITAL_COMMUNITY): Payer: Self-pay | Admitting: General Practice

## 2017-12-26 DIAGNOSIS — K219 Gastro-esophageal reflux disease without esophagitis: Secondary | ICD-10-CM | POA: Diagnosis not present

## 2017-12-26 DIAGNOSIS — Z79899 Other long term (current) drug therapy: Secondary | ICD-10-CM | POA: Insufficient documentation

## 2017-12-26 DIAGNOSIS — N201 Calculus of ureter: Secondary | ICD-10-CM | POA: Insufficient documentation

## 2017-12-26 DIAGNOSIS — F419 Anxiety disorder, unspecified: Secondary | ICD-10-CM | POA: Diagnosis not present

## 2017-12-26 DIAGNOSIS — F329 Major depressive disorder, single episode, unspecified: Secondary | ICD-10-CM | POA: Diagnosis not present

## 2017-12-26 DIAGNOSIS — J45909 Unspecified asthma, uncomplicated: Secondary | ICD-10-CM | POA: Insufficient documentation

## 2017-12-26 DIAGNOSIS — Z87891 Personal history of nicotine dependence: Secondary | ICD-10-CM | POA: Insufficient documentation

## 2017-12-26 HISTORY — DX: Personal history of urinary calculi: Z87.442

## 2017-12-26 HISTORY — PX: EXTRACORPOREAL SHOCK WAVE LITHOTRIPSY: SHX1557

## 2017-12-26 LAB — PREGNANCY, URINE: Preg Test, Ur: NEGATIVE

## 2017-12-26 SURGERY — LITHOTRIPSY, ESWL
Anesthesia: LOCAL | Laterality: Left

## 2017-12-26 MED ORDER — DIAZEPAM 5 MG PO TABS
10.0000 mg | ORAL_TABLET | ORAL | Status: AC
  Administered 2017-12-26: 10 mg via ORAL
  Filled 2017-12-26: qty 2

## 2017-12-26 MED ORDER — CIPROFLOXACIN HCL 500 MG PO TABS
500.0000 mg | ORAL_TABLET | ORAL | Status: AC
  Administered 2017-12-26: 500 mg via ORAL
  Filled 2017-12-26: qty 1

## 2017-12-26 MED ORDER — DIPHENHYDRAMINE HCL 25 MG PO CAPS
25.0000 mg | ORAL_CAPSULE | ORAL | Status: AC
  Administered 2017-12-26: 25 mg via ORAL
  Filled 2017-12-26: qty 1

## 2017-12-26 MED ORDER — SODIUM CHLORIDE 0.9 % IV SOLN
INTRAVENOUS | Status: DC
  Administered 2017-12-26: 08:00:00 via INTRAVENOUS

## 2017-12-26 NOTE — H&P (Signed)
CC: I have ureteral stone.  HPI: Carolyn Parker is a 37 year-old female patient who is here for ureteral stone.  The problem is on the left side. She first stated noticing pain on 12/14/2017. This is her first kidney stone. She is currently having flank pain and groin pain. She denies having back pain, nausea, vomiting, fever, and chills. Pain is occuring on the left side. She has not caught a stone in her urine strainer since her symptoms began.   She has never had surgical treatment for calculi in the past.   Patient developed left flank pain December 14, 2017. [facility] in the ample [place] which revealed a 4 mm left proximal stone. No other stones were commented on. Her white count was 9, creatinine 0.82. Urine culture grew enterococcus sensitive to ampicillin and nitrofurantoin fluoroquinolones. She's taking Cipro.  Repeat culture with no pathologic growth. KUB showed stable stone left proximal ureter in our office.     ALLERGIES: None   MEDICATIONS: Ketorolac Tromethamine 10 mg tablet  Aripiprazole 2 mg tablet  Ciprofloxacin Hcl 500 mg tablet  Diphenoxylate-Atropine  Duloxetine Hcl 30 mg capsule,delayed release  Oxycodone Hcl     GU PSH: None   NON-GU PSH: None   GU PMH: Endometriosis, Unspec    NON-GU PMH: Anxiety Asthma Chronic fatigue, unspecified Depression GERD Other irritable bowel syndrome    FAMILY HISTORY: 2 sons - Other Colon Cancer - Mother Hypertension - Father Kidney Stones - Mother, Father   SOCIAL HISTORY: Marital Status: Married Preferred Language: English; Race: White Current Smoking Status: Patient does not smoke anymore.   Tobacco Use Assessment Completed: Used Tobacco in last 30 days? Drinks 1 caffeinated drink per day.    REVIEW OF SYSTEMS:    GU Review Female:   Patient denies frequent urination, hard to postpone urination, burning /pain with urination, get up at night to urinate, leakage of urine, stream starts and  stops, trouble starting your stream, have to strain to urinate, and being pregnant.  Gastrointestinal (Upper):   Patient reports nausea and vomiting. Patient denies indigestion/ heartburn.  Gastrointestinal (Lower):   Patient reports diarrhea and constipation.   Constitutional:   Patient reports fever, weight loss, and fatigue. Patient denies night sweats.  Skin:   Patient denies skin rash/ lesion and itching.  Eyes:   Patient denies blurred vision and double vision.  Ears/ Nose/ Throat:   Patient denies sore throat and sinus problems.  Hematologic/Lymphatic:   Patient denies easy bruising and swollen glands.  Cardiovascular:   Patient denies leg swelling and chest pains.  Respiratory:   Patient denies cough and shortness of breath.  Endocrine:   Patient denies excessive thirst.  Musculoskeletal:   Patient reports back pain. Patient denies joint pain.  Neurological:   Patient denies headaches and dizziness.  Psychologic:   Patient denies depression and anxiety.   Notes: Blood in urine UTI Vaginal bleeding    VITAL SIGNS:      12/21/2017 09:36 AM  Weight 148 lb / 67.13 kg  Height 60 in / 152.4 cm  BP 136/80 mmHg  Heart Rate 84 /min  Temperature 98.5 F / 36.9 C  BMI 28.9 kg/m   MULTI-SYSTEM PHYSICAL EXAMINATION:    Constitutional: Well-nourished. No physical deformities. Normally developed. Good grooming.  Neck: Neck symmetrical, not swollen. Normal tracheal position.  Respiratory: No labored breathing, no use of accessory muscles.   Cardiovascular: Normal temperature, normal extremity pulses, no swelling, no varicosities.  Skin:  No paleness, no jaundice, no cyanosis. No lesion, no ulcer, no rash.  Neurologic / Psychiatric: Oriented to time, oriented to place, oriented to person. No depression, no anxiety, no agitation.  Gastrointestinal: No mass, no tenderness, no rigidity, non obese abdomen.  Eyes: Normal conjunctivae. Normal eyelids.  Ears, Nose, Mouth, and Throat: Left ear no  scars, no lesions, no masses. Right ear no scars, no lesions, no masses. Nose no scars, no lesions, no masses. Normal hearing. Normal lips.  Musculoskeletal: Normal gait and station of head and neck.     PAST DATA REVIEWED:  Source Of History:  Patient   PROCEDURES:         KUB - [id number]  A single view of the abdomen is obtained.      The bones appeared normal. The bowel gas pattern appeared normal. The soft tissues were unremarkable.         Urinalysis w/Scope Dipstick Dipstick Cont'd Micro  Color: Yellow Bilirubin: Neg WBC/hpf: 20 - 40/hpf  Appearance: Clear Ketones: 3+ RBC/hpf: >60/hpf  Specific Gravity: 1.025 Blood: 3+ Bacteria: Many (>50/hpf)  pH: 5.5 Protein: 3+ Cystals: NS (Not Seen)  Glucose: Neg Urobilinogen: 0.2 Casts: NS (Not Seen)    Nitrites: Neg Trichomonas: Not Present    Leukocyte Esterase: 1+ Mucous: Present      Epithelial Cells: 6 - 10/hpf      Yeast: NS (Not Seen)      Sperm: Not Present    Notes: TRANSITIONAL EPIS NOTED          Ketoralac 60mg  - [id number], [id number] Qty: 60 Adm. By: Tawnya Crook  Unit: mg Lot No [id number]  Route: IM Exp. Date 12/31/2018  Freq: None Mfgr.:   Site: Left Hip   ASSESSMENT:      ICD-10 Details  1 GU:   Ureteral calculus - N20.1    PLAN:            Medications New Meds: Tamsulosin Hcl 0.4 mg capsule 1 capsule PO Daily   #14  0 Refill(s)            Orders Labs Urine Culture  X-Rays: KUB          Schedule Return Visit/Planned Activity: Next Available Appointment - Schedule Surgery          Document Letter(s):  Created for Patient: Clinical Summary         Notes:   ureteral stone, left -- Left ESWL

## 2017-12-26 NOTE — H&P (View-Only) (Signed)
CC: I have ureteral stone.  HPI: Carolyn Parker is a 37 year-old female patient who is here for ureteral stone.  The problem is on the left side. She first stated noticing pain on 12/14/2017. This is her first kidney stone. She is currently having flank pain and groin pain. She denies having back pain, nausea, vomiting, fever, and chills. Pain is occuring on the left side. She has not caught a stone in her urine strainer since her symptoms began.   She has never had surgical treatment for calculi in the past.   Patient developed left flank pain December 14, 2017. [facility] in the [place] which revealed a 4 mm left proximal stone. No other stones were commented on. Her white count was 9, creatinine 0.82. Urine culture grew enterococcus sensitive to ampicillin and nitrofurantoin fluoroquinolones. She's taking Cipro.  Repeat culture with no pathologic growth. KUB showed stable stone left proximal ureter in our office.     ALLERGIES: None   MEDICATIONS: Ketorolac Tromethamine 10 mg tablet  Aripiprazole 2 mg tablet  Ciprofloxacin Hcl 500 mg tablet  Diphenoxylate-Atropine  Duloxetine Hcl 30 mg capsule,delayed release  Oxycodone Hcl     GU PSH: None   NON-GU PSH: None   GU PMH: Endometriosis, Unspec    NON-GU PMH: Anxiety Asthma Chronic fatigue, unspecified Depression GERD Other irritable bowel syndrome    FAMILY HISTORY: 2 sons - Other Colon Cancer - Mother Hypertension - Father Kidney Stones - Mother, Father   SOCIAL HISTORY: Marital Status: Married Preferred Language: English; Race: White Current Smoking Status: Patient does not smoke anymore.   Tobacco Use Assessment Completed: Used Tobacco in last 30 days? Drinks 1 caffeinated drink per day.    REVIEW OF SYSTEMS:    GU Review Female:   Patient denies frequent urination, hard to postpone urination, burning /pain with urination, get up at night to urinate, leakage of urine, stream starts and  stops, trouble starting your stream, have to strain to urinate, and being pregnant.  Gastrointestinal (Upper):   Patient reports nausea and vomiting. Patient denies indigestion/ heartburn.  Gastrointestinal (Lower):   Patient reports diarrhea and constipation.   Constitutional:   Patient reports fever, weight loss, and fatigue. Patient denies night sweats.  Skin:   Patient denies skin rash/ lesion and itching.  Eyes:   Patient denies blurred vision and double vision.  Ears/ Nose/ Throat:   Patient denies sore throat and sinus problems.  Hematologic/Lymphatic:   Patient denies easy bruising and swollen glands.  Cardiovascular:   Patient denies leg swelling and chest pains.  Respiratory:   Patient denies cough and shortness of breath.  Endocrine:   Patient denies excessive thirst.  Musculoskeletal:   Patient reports back pain. Patient denies joint pain.  Neurological:   Patient denies headaches and dizziness.  Psychologic:   Patient denies depression and anxiety.   Notes: Blood in urine UTI Vaginal bleeding    VITAL SIGNS:      12/21/2017 09:36 AM  Weight 148 lb / 67.13 kg  Height 60 in / 152.4 cm  BP 136/80 mmHg  Heart Rate 84 /min  Temperature 98.5 F / 36.9 C  BMI 28.9 kg/m   MULTI-SYSTEM PHYSICAL EXAMINATION:    Constitutional: Well-nourished. No physical deformities. Normally developed. Good grooming.  Neck: Neck symmetrical, not swollen. Normal tracheal position.  Respiratory: No labored breathing, no use of accessory muscles.   Cardiovascular: Normal temperature, normal extremity pulses, no swelling, no varicosities.  Skin:  No paleness, no jaundice, no cyanosis. No lesion, no ulcer, no rash.  Neurologic / Psychiatric: Oriented to time, oriented to place, oriented to person. No depression, no anxiety, no agitation.  Gastrointestinal: No mass, no tenderness, no rigidity, non obese abdomen.  Eyes: Normal conjunctivae. Normal eyelids.  Ears, Nose, Mouth, and Throat: Left ear no  scars, no lesions, no masses. Right ear no scars, no lesions, no masses. Nose no scars, no lesions, no masses. Normal hearing. Normal lips.  Musculoskeletal: Normal gait and station of head and neck.     PAST DATA REVIEWED:  Source Of History:  Patient   PROCEDURES:         KUB - [id number]  A single view of the abdomen is obtained.      The bones appeared normal. The bowel gas pattern appeared normal. The soft tissues were unremarkable.         Urinalysis w/Scope Dipstick Dipstick Cont'd Micro  Color: Yellow Bilirubin: Neg WBC/hpf: 20 - 40/hpf  Appearance: Clear Ketones: 3+ RBC/hpf: >60/hpf  Specific Gravity: 1.025 Blood: 3+ Bacteria: Many (>50/hpf)  pH: 5.5 Protein: 3+ Cystals: NS (Not Seen)  Glucose: Neg Urobilinogen: 0.2 Casts: NS (Not Seen)    Nitrites: Neg Trichomonas: Not Present    Leukocyte Esterase: 1+ Mucous: Present      Epithelial Cells: 6 - 10/hpf      Yeast: NS (Not Seen)      Sperm: Not Present    Notes: TRANSITIONAL EPIS NOTED          Ketoralac 60mg  - [id number], [id number] Qty: 60 Adm. By: Tawnya Crook  Unit: mg Lot No [id number]  Route: IM Exp. Date 12/31/2018  Freq: None Mfgr.:   Site: Left Hip   ASSESSMENT:      ICD-10 Details  1 GU:   Ureteral calculus - N20.1    PLAN:            Medications New Meds: Tamsulosin Hcl 0.4 mg capsule 1 capsule PO Daily   #14  0 Refill(s)            Orders Labs Urine Culture  X-Rays: KUB          Schedule Return Visit/Planned Activity: Next Available Appointment - Schedule Surgery          Document Letter(s):  Created for Patient: Clinical Summary         Notes:   ureteral stone, left -- Left ESWL

## 2017-12-27 ENCOUNTER — Ambulatory Visit (HOSPITAL_COMMUNITY): Payer: BLUE CROSS/BLUE SHIELD | Admitting: Certified Registered Nurse Anesthetist

## 2017-12-27 ENCOUNTER — Observation Stay (HOSPITAL_COMMUNITY): Payer: BLUE CROSS/BLUE SHIELD

## 2017-12-27 ENCOUNTER — Other Ambulatory Visit: Payer: Self-pay | Admitting: Urology

## 2017-12-27 ENCOUNTER — Observation Stay (HOSPITAL_COMMUNITY)
Admission: EM | Admit: 2017-12-27 | Discharge: 2017-12-28 | Disposition: A | Discharge status: Home / Self Care | Payer: BLUE CROSS/BLUE SHIELD | Source: Ambulatory Visit | Attending: Urology | Admitting: Urology

## 2017-12-27 ENCOUNTER — Encounter (HOSPITAL_COMMUNITY): Payer: Self-pay | Admitting: Urology

## 2017-12-27 ENCOUNTER — Encounter (HOSPITAL_COMMUNITY)
Admission: EM | Disposition: A | Discharge status: Home / Self Care | Payer: Self-pay | Source: Ambulatory Visit | Attending: Urology

## 2017-12-27 DIAGNOSIS — Z87891 Personal history of nicotine dependence: Secondary | ICD-10-CM | POA: Insufficient documentation

## 2017-12-27 DIAGNOSIS — Z8249 Family history of ischemic heart disease and other diseases of the circulatory system: Secondary | ICD-10-CM | POA: Diagnosis not present

## 2017-12-27 DIAGNOSIS — Z91018 Allergy to other foods: Secondary | ICD-10-CM | POA: Insufficient documentation

## 2017-12-27 DIAGNOSIS — Z87892 Personal history of anaphylaxis: Secondary | ICD-10-CM | POA: Insufficient documentation

## 2017-12-27 DIAGNOSIS — Z880 Allergy status to penicillin: Secondary | ICD-10-CM | POA: Insufficient documentation

## 2017-12-27 DIAGNOSIS — Z7951 Long term (current) use of inhaled steroids: Secondary | ICD-10-CM | POA: Insufficient documentation

## 2017-12-27 DIAGNOSIS — Z800 Family history of malignant neoplasm of digestive organs: Secondary | ICD-10-CM | POA: Insufficient documentation

## 2017-12-27 DIAGNOSIS — Z91013 Allergy to seafood: Secondary | ICD-10-CM | POA: Diagnosis not present

## 2017-12-27 DIAGNOSIS — Z888 Allergy status to other drugs, medicaments and biological substances status: Secondary | ICD-10-CM | POA: Insufficient documentation

## 2017-12-27 DIAGNOSIS — F329 Major depressive disorder, single episode, unspecified: Secondary | ICD-10-CM | POA: Diagnosis not present

## 2017-12-27 DIAGNOSIS — N136 Pyonephrosis: Principal | ICD-10-CM | POA: Insufficient documentation

## 2017-12-27 DIAGNOSIS — F419 Anxiety disorder, unspecified: Secondary | ICD-10-CM | POA: Diagnosis not present

## 2017-12-27 DIAGNOSIS — Z79899 Other long term (current) drug therapy: Secondary | ICD-10-CM | POA: Diagnosis not present

## 2017-12-27 DIAGNOSIS — N200 Calculus of kidney: Secondary | ICD-10-CM

## 2017-12-27 DIAGNOSIS — Z91041 Radiographic dye allergy status: Secondary | ICD-10-CM | POA: Insufficient documentation

## 2017-12-27 DIAGNOSIS — Z833 Family history of diabetes mellitus: Secondary | ICD-10-CM | POA: Insufficient documentation

## 2017-12-27 DIAGNOSIS — N132 Hydronephrosis with renal and ureteral calculous obstruction: Secondary | ICD-10-CM | POA: Diagnosis present

## 2017-12-27 DIAGNOSIS — R000 Tachycardia, unspecified: Secondary | ICD-10-CM | POA: Insufficient documentation

## 2017-12-27 DIAGNOSIS — Z91030 Bee allergy status: Secondary | ICD-10-CM | POA: Diagnosis not present

## 2017-12-27 HISTORY — PX: CYSTOSCOPY WITH RETROGRADE PYELOGRAM, URETEROSCOPY AND STENT PLACEMENT: SHX5789

## 2017-12-27 SURGERY — CYSTOURETEROSCOPY, WITH RETROGRADE PYELOGRAM AND STENT INSERTION
Anesthesia: General | Laterality: Left

## 2017-12-27 MED ORDER — DEXTROSE-NACL 5-0.9 % IV SOLN
INTRAVENOUS | Status: DC
  Administered 2017-12-27 – 2017-12-28 (×2): via INTRAVENOUS

## 2017-12-27 MED ORDER — SENNA 8.6 MG PO TABS
1.0000 | ORAL_TABLET | Freq: Two times a day (BID) | ORAL | Status: DC
Start: 2017-12-27 — End: 2017-12-28
  Administered 2017-12-27 – 2017-12-28 (×2): 8.6 mg via ORAL
  Filled 2017-12-27 (×2): qty 1

## 2017-12-27 MED ORDER — PROMETHAZINE HCL 25 MG/ML IJ SOLN
INTRAMUSCULAR | Status: AC
  Filled 2017-12-27: qty 1

## 2017-12-27 MED ORDER — PROMETHAZINE HCL 25 MG PO TABS: 25.0000 mg | ORAL_TABLET | Freq: Four times a day (QID) | ORAL | Status: DC | PRN

## 2017-12-27 MED ORDER — DOCUSATE SODIUM 100 MG PO CAPS
100.0000 mg | ORAL_CAPSULE | Freq: Two times a day (BID) | ORAL | Status: DC
  Administered 2017-12-27 – 2017-12-28 (×2): 100 mg via ORAL
  Filled 2017-12-27 (×2): qty 1

## 2017-12-27 MED ORDER — ONDANSETRON HCL 4 MG/2ML IJ SOLN
4.0000 mg | INTRAMUSCULAR | Status: DC | PRN
  Administered 2017-12-28 (×2): 4 mg via INTRAVENOUS
  Filled 2017-12-27 (×2): qty 2

## 2017-12-27 MED ORDER — FENTANYL CITRATE (PF) 100 MCG/2ML IJ SOLN
INTRAMUSCULAR | Status: AC
  Filled 2017-12-27: qty 2

## 2017-12-27 MED ORDER — OXYCODONE HCL 5 MG PO TABS
ORAL_TABLET | ORAL | Status: AC
  Filled 2017-12-27: qty 1

## 2017-12-27 MED ORDER — KETOROLAC TROMETHAMINE 30 MG/ML IJ SOLN
INTRAMUSCULAR | Status: AC
  Filled 2017-12-27: qty 1

## 2017-12-27 MED ORDER — LIDOCAINE HCL (CARDIAC) 20 MG/ML IV SOLN
INTRAVENOUS | Status: DC | PRN
  Administered 2017-12-27: 100 mg via INTRAVENOUS

## 2017-12-27 MED ORDER — DEXAMETHASONE SODIUM PHOSPHATE 10 MG/ML IJ SOLN
INTRAMUSCULAR | Status: AC
  Filled 2017-12-27: qty 1

## 2017-12-27 MED ORDER — LIDOCAINE 2% (20 MG/ML) 5 ML SYRINGE
INTRAMUSCULAR | Status: AC
  Filled 2017-12-27: qty 5

## 2017-12-27 MED ORDER — PROMETHAZINE HCL 25 MG/ML IJ SOLN
6.2500 mg | INTRAMUSCULAR | Status: DC | PRN
  Administered 2017-12-27: 6.25 mg via INTRAVENOUS

## 2017-12-27 MED ORDER — TAMSULOSIN HCL 0.4 MG PO CAPS: 0.4000 mg | ORAL_CAPSULE | Freq: Every day | ORAL | Status: DC

## 2017-12-27 MED ORDER — IOHEXOL 300 MG/ML  SOLN
INTRAMUSCULAR | Status: DC | PRN
  Administered 2017-12-27: 19:00:00

## 2017-12-27 MED ORDER — DULOXETINE HCL 30 MG PO CPEP
30.0000 mg | ORAL_CAPSULE | Freq: Every day | ORAL | Status: DC
  Administered 2017-12-28: 30 mg via ORAL
  Filled 2017-12-27: qty 1

## 2017-12-27 MED ORDER — MIDAZOLAM HCL 5 MG/5ML IJ SOLN
INTRAMUSCULAR | Status: DC | PRN
  Administered 2017-12-27: 1 mg via INTRAVENOUS

## 2017-12-27 MED ORDER — MORPHINE SULFATE (PF) 4 MG/ML IV SOLN: 2.0000 mg | INTRAVENOUS | Status: DC | PRN

## 2017-12-27 MED ORDER — ACETAMINOPHEN 325 MG PO TABS
650.0000 mg | ORAL_TABLET | ORAL | Status: DC | PRN
Start: 2017-12-27 — End: 2017-12-28

## 2017-12-27 MED ORDER — FENTANYL CITRATE (PF) 100 MCG/2ML IJ SOLN
INTRAMUSCULAR | Status: DC | PRN
  Administered 2017-12-27: 50 ug via INTRAVENOUS

## 2017-12-27 MED ORDER — ONDANSETRON HCL 4 MG/2ML IJ SOLN
INTRAMUSCULAR | Status: AC
  Filled 2017-12-27: qty 2

## 2017-12-27 MED ORDER — OXYBUTYNIN CHLORIDE 5 MG PO TABS
5.0000 mg | ORAL_TABLET | Freq: Three times a day (TID) | ORAL | Status: DC | PRN
Start: 2017-12-27 — End: 2017-12-28
  Administered 2017-12-28 (×2): 5 mg via ORAL
  Filled 2017-12-27 (×2): qty 1

## 2017-12-27 MED ORDER — GENTAMICIN SULFATE 40 MG/ML IJ SOLN
5.0000 mg/kg | INTRAVENOUS | Status: AC
  Administered 2017-12-27: 270.4 mg via INTRAVENOUS
  Filled 2017-12-27 (×2): qty 6.75

## 2017-12-27 MED ORDER — MIDAZOLAM HCL 2 MG/2ML IJ SOLN
INTRAMUSCULAR | Status: AC
  Filled 2017-12-27: qty 2

## 2017-12-27 MED ORDER — MECLIZINE HCL 25 MG PO TABS: 25.0000 mg | ORAL_TABLET | Freq: Three times a day (TID) | ORAL | Status: DC | PRN

## 2017-12-27 MED ORDER — DEXAMETHASONE SODIUM PHOSPHATE 10 MG/ML IJ SOLN
INTRAMUSCULAR | Status: DC | PRN
Start: 2017-12-27 — End: 2017-12-27
  Administered 2017-12-27: 10 mg via INTRAVENOUS

## 2017-12-27 MED ORDER — PROPOFOL 10 MG/ML IV BOLUS
INTRAVENOUS | Status: DC | PRN
  Administered 2017-12-27: 200 mg via INTRAVENOUS

## 2017-12-27 MED ORDER — FENTANYL CITRATE (PF) 100 MCG/2ML IJ SOLN: 25.0000 ug | INTRAMUSCULAR | Status: DC | PRN

## 2017-12-27 MED ORDER — LACTATED RINGERS IV SOLN
INTRAVENOUS | Status: DC
  Administered 2017-12-27: 18:00:00 via INTRAVENOUS

## 2017-12-27 MED ORDER — OXYCODONE HCL 5 MG PO TABS
5.0000 mg | ORAL_TABLET | ORAL | Status: DC | PRN
  Administered 2017-12-28 (×3): 5 mg via ORAL
  Filled 2017-12-27 (×3): qty 1

## 2017-12-27 MED ORDER — KETOROLAC TROMETHAMINE 30 MG/ML IJ SOLN
30.0000 mg | Freq: Once | INTRAMUSCULAR | Status: DC | PRN
  Administered 2017-12-27: 30 mg via INTRAVENOUS

## 2017-12-27 MED ORDER — ONDANSETRON HCL 4 MG/2ML IJ SOLN
INTRAMUSCULAR | Status: DC | PRN
  Administered 2017-12-27: 4 mg via INTRAVENOUS

## 2017-12-27 MED ORDER — PROPOFOL 10 MG/ML IV BOLUS
INTRAVENOUS | Status: AC
  Filled 2017-12-27: qty 20

## 2017-12-27 MED ORDER — OXYCODONE HCL 5 MG PO TABS
5.0000 mg | ORAL_TABLET | Freq: Once | ORAL | Status: AC | PRN
  Administered 2017-12-27: 5 mg via ORAL

## 2017-12-27 MED ORDER — OXYCODONE HCL 5 MG/5ML PO SOLN
5.0000 mg | Freq: Once | ORAL | Status: AC | PRN
  Filled 2017-12-27: qty 5

## 2017-12-27 SURGICAL SUPPLY — 26 items
BAG URO CATCHER STRL LF (MISCELLANEOUS) ×2 IMPLANT
BASKET LASER NITINOL 1.9FR (BASKET) IMPLANT
BSKT STON RTRVL 120 1.9FR (BASKET)
CATH INTERMIT  6FR 70CM (CATHETERS) ×2 IMPLANT
CLOTH BEACON ORANGE TIMEOUT ST (SAFETY) ×2 IMPLANT
COVER FOOTSWITCH UNIV (MISCELLANEOUS) IMPLANT
COVER SURGICAL LIGHT HANDLE (MISCELLANEOUS) ×1 IMPLANT
FIBER LASER FLEXIVA 1000 (UROLOGICAL SUPPLIES) IMPLANT
FIBER LASER FLEXIVA 365 (UROLOGICAL SUPPLIES) IMPLANT
FIBER LASER FLEXIVA 550 (UROLOGICAL SUPPLIES) IMPLANT
FIBER LASER TRAC TIP (UROLOGICAL SUPPLIES) IMPLANT
GLOVE BIOGEL M STRL SZ7.5 (GLOVE) ×2 IMPLANT
GOWN STRL REUS W/TWL LRG LVL3 (GOWN DISPOSABLE) ×4 IMPLANT
GUIDEWIRE ANG ZIPWIRE 038X150 (WIRE) ×2 IMPLANT
GUIDEWIRE STR DUAL SENSOR (WIRE) ×2 IMPLANT
IV NS 1000ML (IV SOLUTION) ×2
IV NS 1000ML BAXH (IV SOLUTION) ×1 IMPLANT
MANIFOLD NEPTUNE II (INSTRUMENTS) ×2 IMPLANT
PACK CYSTO (CUSTOM PROCEDURE TRAY) ×2 IMPLANT
SHEATH ACCESS URETERAL 24CM (SHEATH) IMPLANT
SHEATH ACCESS URETERAL 54CM (SHEATH) IMPLANT
SHEATH URETERAL 12FRX35CM (MISCELLANEOUS) IMPLANT
STENT URET 6FRX24 CONTOUR (STENTS) ×1 IMPLANT
SYR CONTROL 10ML LL (SYRINGE) ×2 IMPLANT
TUBE FEEDING 8FR 16IN STR KANG (MISCELLANEOUS) ×2 IMPLANT
TUBING CONNECTING 10 (TUBING) ×2 IMPLANT

## 2017-12-27 NOTE — Anesthesia Preprocedure Evaluation (Signed)
Anesthesia Evaluation  Patient identified by MRN, date of birth, ID band Patient awake    Reviewed: Allergy & Precautions, NPO status , Patient's Chart, lab work & pertinent test results  Airway Mallampati: II  TM Distance: >3 FB Neck ROM: Full    Dental no notable dental hx.    Pulmonary neg pulmonary ROS, former smoker,    Pulmonary exam normal breath sounds clear to auscultation       Cardiovascular negative cardio ROS Normal cardiovascular exam Rhythm:Regular Rate:Normal     Neuro/Psych Anxiety negative neurological ROS     GI/Hepatic Neg liver ROS, GERD  ,  Endo/Other  negative endocrine ROS  Renal/GU negative Renal ROS  negative genitourinary   Musculoskeletal negative musculoskeletal ROS (+)   Abdominal   Peds negative pediatric ROS (+)  Hematology negative hematology ROS (+)   Anesthesia Other Findings   Reproductive/Obstetrics negative OB ROS                             Anesthesia Physical Anesthesia Plan  ASA: II  Anesthesia Plan: General   Post-op Pain Management:    Induction: Intravenous  PONV Risk Score and Plan: 3 and Ondansetron, Dexamethasone and Midazolam  Airway Management Planned: LMA  Additional Equipment:   Intra-op Plan:   Post-operative Plan: Extubation in OR  Informed Consent: I have reviewed the patients History and Physical, chart, labs and discussed the procedure including the risks, benefits and alternatives for the proposed anesthesia with the patient or authorized representative who has indicated his/her understanding and acceptance.   Dental advisory given  Plan Discussed with: CRNA and Surgeon  Anesthesia Plan Comments:         Anesthesia Quick Evaluation

## 2017-12-27 NOTE — Anesthesia Procedure Notes (Signed)
Procedure Name: LMA Insertion Date/Time: 12/27/2017 6:25 PM Performed by: Vanessa Durhamochran, Hazem Kenner Glenn, CRNA Pre-anesthesia Checklist: Emergency Drugs available, Patient identified, Suction available and Patient being monitored Patient Re-evaluated:Patient Re-evaluated prior to induction Oxygen Delivery Method: Circle system utilized Preoxygenation: Pre-oxygenation with 100% oxygen Induction Type: IV induction Ventilation: Mask ventilation without difficulty LMA: LMA inserted LMA Size: 4.0 Number of attempts: 1 Placement Confirmation: positive ETCO2 and breath sounds checked- equal and bilateral Tube secured with: Tape Dental Injury: Teeth and Oropharynx as per pre-operative assessment

## 2017-12-27 NOTE — Brief Op Note (Signed)
12/27/2017  6:39 PM  PATIENT:  Carolyn Parker  37 y.o. female  PRE-OPERATIVE DIAGNOSIS:  Left Ureteral Calculus  POST-OPERATIVE DIAGNOSIS:  Left Ureteral Calculus  PROCEDURE:  Procedure(s): CYSTOSCOPY WITH RETROGRADE PYELOGRAM, URETEROSCOPY AND STENT PLACEMENT (Left)  SURGEON:  Surgeon(s) and Role:    Sebastian Ache* Elda Dunkerson, MD - Primary  PHYSICIAN ASSISTANT:   ASSISTANTS: none   ANESTHESIA:   general  EBL:  none  BLOOD ADMINISTERED:none  DRAINS: none   LOCAL MEDICATIONS USED:  NONE  SPECIMEN:  No Specimen  DISPOSITION OF SPECIMEN:  N/A  COUNTS:  YES  TOURNIQUET:  * No tourniquets in log *  DICTATION: .Other Dictation: Dictation Number [id number]  PLAN OF CARE: Admit for overnight observation  PATIENT DISPOSITION:  PACU - hemodynamically stable.   Delay start of Pharmacological VTE agent (>24hrs) due to surgical blood loss or risk of bleeding: yes

## 2017-12-27 NOTE — Transfer of Care (Signed)
Immediate Anesthesia Transfer of Care Note  Patient: Carolyn Parker  Procedure(s) Performed: CYSTOSCOPY WITH RETROGRADE PYELOGRAM, URETEROSCOPY AND STENT PLACEMENT (Left )  Patient Location: PACU  Anesthesia Type:General  Level of Consciousness: awake, alert , oriented and patient cooperative  Airway & Oxygen Therapy: Patient Spontanous Breathing and Patient connected to face mask  Post-op Assessment: Report given to RN and Post -op Vital signs reviewed and stable  Post vital signs: Reviewed and stable  Last Vitals: There were no vitals filed for this visit.  Last Pain:  Vitals:   12/27/17 1740  PainSc: 3          Complications: No apparent anesthesia complications

## 2017-12-27 NOTE — H&P (Signed)
Urology H+P Note   Admitting Attending: Dr. Berneice HeinrichManny  Chief Complaint:  Left flank pain  HPI: Carolyn Parker is a 37 y.o. female with a history of nephrolithiasis s/p ESWL for a 4mm LEFT proximal ureteral stone on 12/26/17. She did well initially, but overnight developed worsening flank pain a/w fevers to 102F.   She was seen in clinic today, at which point imaging revealed an obstructing stone in the mid/distal ureter.     Past Medical History: Past Medical History:  Diagnosis Date  . Anxiety   . Depression   . Family history of breast cancer   . Genetic testing 12/2013   My Risk neg   . GERD (gastroesophageal reflux disease)   . History of kidney stones     Past Surgical History:  Past Surgical History:  Procedure Laterality Date  . ESSURE TUBAL LIGATION    . EXTRACORPOREAL SHOCK WAVE LITHOTRIPSY Left 12/26/2017   Procedure: LEFT EXTRACORPOREAL SHOCK WAVE LITHOTRIPSY (ESWL);  Surgeon: Hildred LaserBudzyn, Brian James, MD;  Location: [facility];  Service: Urology;  Laterality: Left;  . LAPAROSCOPIC ENDOMETRIOSIS FULGURATION      Medication: Current Facility-Administered Medications  Medication Dose Route Frequency Provider Last Rate Last Dose  . gentamicin (GARAMYCIN) 270 mg in dextrose 5 % 100 mL IVPB  5 mg/kg (Adjusted) Intravenous To OR Sebastian AcheManny, Theodore, MD      . lactated ringers infusion   Intravenous Continuous Eilene Ghaziose, George, MD 50 mL/hr at 12/27/17 1750      Allergies: Allergies  Allergen Reactions  . Bee Venom Anaphylaxis    Uncoded Allergy. Allergen: bee sting  . Iodinated Diagnostic Agents Anaphylaxis    Uncoded Allergy. Allergen: bee sting  . Amoxicillin-Pot Clavulanate Nausea And Vomiting    Other reaction(s): Vomiting  . Beef-Derived Products Diarrhea and Nausea And Vomiting  . Dairy Aid [Lactase] Diarrhea and Nausea And Vomiting  . Pork-Derived Products Diarrhea and Nausea And Vomiting  . Sertraline Other (See Comments)    Other reaction(s): Other (See Comments) Other  Reaction: Other reaction-serotonin syn Other Reaction: Other reaction-serotonin syn  . Shellfish Allergy Diarrhea and Nausea And Vomiting  . Wheat Bran Diarrhea and Nausea And Vomiting    Social History: Social History   Tobacco Use  . Smoking status: Former Smoker    Types: Cigarettes    Start date: 05/20/2000    Last attempt to quit: 05/20/2010    Years since quitting: 7.6  . Smokeless tobacco: Never Used  Substance Use Topics  . Alcohol use: No    Alcohol/week: 0.0 oz    Comment: social   . Drug use: No    Comment: last used in june    Family History Family History  Problem Relation Age of Onset  . Anxiety disorder Mother   . Depression Mother   . Diabetes Mother   . Heart Problems Mother   . Colon cancer Mother [id number]  . Hypertension Father   . Bipolar disorder Sister   . Colon cancer Maternal Grandmother 80  . Breast cancer Maternal Aunt 30    Review of Systems 10 systems were reviewed and are negative except as noted specifically in the HPI.  Objective   Vital signs in last 24 hours: Ht 5' (1.524 m)   Wt 67.1 kg (148 lb)   LMP 12/13/2017 Comment: Neg preg test  12/26/2017  BMI 28.90 kg/m   Physical Exam General: NAD, A&O, resting, appropriate HEENT: [place]/AT, EOMI, MMM Pulmonary: Normal work of breathing Cardiovascular: HDS, adequate peripheral perfusion  Abdomen: Soft, NTTP, nondistended, . GU: + L CVA tenderness Extremities: warm and well perfused Neuro: Appropriate, no focal neurological deficits  Most Recent Labs: Lab Results  Component Value Date   WBC 7.6 10/20/2017   HGB 13.5 10/20/2017   HCT 42.0 10/20/2017   PLT 292.0 10/20/2017    Lab Results  Component Value Date   NA 141 10/20/2017   K 4.0 10/20/2017   CL 104 10/20/2017   CO2 29 10/20/2017   BUN 13 10/20/2017   CREATININE 0.82 10/20/2017   CALCIUM 9.2 10/20/2017    No results found for: INR, APTT   IMAGING: Dg Abd 1 View  Result Date: 12/26/2017 CLINICAL DATA:  Pre  lithotripsy study. History of left-sided kidney stone EXAM: ABDOMEN - 1 VIEW COMPARISON:  KUB of October 20, 2018 FINDINGS: A previously demonstrated approximately 3-4 mm diameter stone lying just superior to the left L2 transverse process is not clearly evident today. There is a stable calcification projecting over the left sacral ala. There are numerous pelvic phleboliths. No definite stones within either kidney are observed. There are Essure contraceptive devices present bilaterally. The bony structures are unremarkable. IMPRESSION: The patient may have passed the previously demonstrated left UPJ stone. No definite residual stones are observed. There are numerous pelvic calcifications which are stable and most compatible with phleboliths. Electronically Signed   By: David  [place] M.D.   On: 12/26/2017 07:38    ------  Assessment:  37 y.o. female with a history of nephrolithiasis s/p ESWL for a 4mm LEFT proximal ureteral stone on 12/26/17. Here today with sepsis related to a likely obstructing ureteral stone. Will plan for stent placement. Risks discussed in detail.   Plan: - NPO for cystourethroscopy, left ureteral stent placement, left retrograde pyelogram.

## 2017-12-28 ENCOUNTER — Other Ambulatory Visit: Payer: Self-pay

## 2017-12-28 ENCOUNTER — Encounter (HOSPITAL_COMMUNITY): Payer: Self-pay | Admitting: Urology

## 2017-12-28 DIAGNOSIS — N136 Pyonephrosis: Secondary | ICD-10-CM | POA: Diagnosis not present

## 2017-12-28 LAB — BASIC METABOLIC PANEL
ANION GAP: 9 (ref 5–15)
BUN: 10 mg/dL (ref 6–20)
CHLORIDE: 106 mmol/L (ref 101–111)
CO2: 23 mmol/L (ref 22–32)
Calcium: 9.2 mg/dL (ref 8.9–10.3)
Creatinine, Ser: 0.93 mg/dL (ref 0.44–1.00)
GFR calc non Af Amer: >60 mL/min (ref >60)
Glucose, Bld: 146 mg/dL — ABNORMAL HIGH (ref 65–99)
Potassium: 3.6 mmol/L (ref 3.5–5.1)
Sodium: 138 mmol/L (ref 135–145)

## 2017-12-28 LAB — CBC
HCT: 40.1 % (ref 36.0–46.0)
HEMOGLOBIN: 13.4 g/dL (ref 12.0–15.0)
MCH: 28.8 pg (ref 26.0–34.0)
MCHC: 33.4 g/dL (ref 30.0–36.0)
MCV: 86.1 fL (ref 78.0–100.0)
Platelets: 321 10*3/uL (ref 150–400)
RBC: 4.66 MIL/uL (ref 3.87–5.11)
RDW: 13.2 % (ref 11.5–15.5)
WBC: 7.2 10*3/uL (ref 4.0–10.5)

## 2017-12-28 LAB — HIV ANTIBODY (ROUTINE TESTING W REFLEX): HIV Screen 4th Generation wRfx: NONREACTIVE

## 2017-12-28 MED ORDER — SULFAMETHOXAZOLE-TRIMETHOPRIM 800-160 MG PO TABS: 1.0000 | ORAL_TABLET | Freq: Two times a day (BID) | ORAL | 0 refills | Status: DC

## 2017-12-28 NOTE — Discharge Instructions (Signed)
Ureteral Stent Implantation, Care After Refer to this sheet in the next few weeks. These instructions provide you with information about caring for yourself after your procedure. Your health care provider may also give you more specific instructions. Your treatment has been planned according to current medical practices, but problems sometimes occur. Call your health care provider if you have any problems or questions after your procedure.  Removal of the stent -- be sure to follow-up with Dr. Mena GoesEskridge for stent/stone removal   What can I expect after the procedure? After the procedure, it is common to have:  Nausea.  Mild pain when you urinate. You may feel this pain in your lower back or lower abdomen. Pain should stop within a few minutes after you urinate. This may last for up to 1 week.  A small amount of blood in your urine for several days.  Follow these instructions at home:  Medicines  Take over-the-counter and prescription medicines only as told by your health care provider.  If you were prescribed an antibiotic medicine, take it as told by your health care provider. Do not stop taking the antibiotic even if you start to feel better.  Do not drive for 24 hours if you received a sedative.  Do not drive or operate heavy machinery while taking prescription pain medicines. Activity  Return to your normal activities as told by your health care provider. Ask your health care provider what activities are safe for you.  Do not lift anything that is heavier than 10 lb (4.5 kg). Follow this limit for 1 week after your procedure, or for as long as told by your health care provider. General instructions  Watch for any blood in your urine. Call your health care provider if the amount of blood in your urine increases.  If you have a catheter: ? Follow instructions from your health care provider about taking care of your catheter and collection bag. ? Do not take baths, swim, or use a  hot tub until your health care provider approves.  Drink enough fluid to keep your urine clear or pale yellow.  Keep all follow-up visits as told by your health care provider. This is important. Contact a health care provider if:  You have pain that gets worse or does not get better with medicine, especially pain when you urinate.  You have difficulty urinating.  You feel nauseous or you vomit repeatedly during a period of more than 2 days after the procedure. Get help right away if:  Your urine is dark red or has blood clots in it.  You are leaking urine (have incontinence).  The end of the stent comes out of your urethra.  You cannot urinate.  You have sudden, sharp, or severe pain in your abdomen or lower back.  You have a fever. This information is not intended to replace advice given to you by your health care provider. Make sure you discuss any questions you have with your health care provider. Document Released: 06/20/2013 Document Revised: 03/25/2016 Document Reviewed: 05/02/2015 Elsevier Interactive Patient Education  [employer]2018 [employer] Inc[employer]

## 2017-12-28 NOTE — Discharge Summary (Signed)
Physician Discharge Summary  Patient ID: Carolyn Parker MRN: [id number] DOB/AGE: 1980-11-18 37 y.o.  Admit date: 12/27/2017 Discharge date: 12/28/2017  Admission Diagnoses: Left proximal stone, left hydronephrosis, tachycardia Discharge Diagnoses:  Active Problems:   Nephrolith   Discharged Condition: good  Hospital Course: She was admitted for fever, tachycardia following shockwave lithotripsy of a left 4 mm proximal stone. She underwent cystoscopy, left retrograde pyelogram and left stent. She did well and tachycardia resolved. She's AFVSS and tolerated breakfast.   Consults: None  Significant Diagnostic Studies: none  Treatments: surgery: cystoscopy, left retrograde pyelogram and left stent  Discharge Exam: Blood pressure 130/87, pulse 83, temperature 98.4 F (36.9 C), resp. rate 16, height 5' (1.524 m), weight 67.1 kg (147 lb 14.9 oz), last menstrual period 12/13/2017, SpO2 100 %. NAD Watching TV, talkative, looks well Alert and O x 3 No focal deficits Regular resp effort and depth   Disposition: 01-Home or Self Care   Allergies as of 12/28/2017      Reactions   Bee Venom Anaphylaxis   Uncoded Allergy. Allergen: bee sting   Iodinated Diagnostic Agents Anaphylaxis   Uncoded Allergy. Allergen: bee sting   Amoxicillin-pot Clavulanate Nausea And Vomiting   Other reaction(s): Vomiting   Beef-derived Products Diarrhea, Nausea And Vomiting   Dairy Aid [lactase] Diarrhea, Nausea And Vomiting   Pork-derived Products Diarrhea, Nausea And Vomiting   Sertraline Other (See Comments)   Other reaction(s): Other (See Comments) Other Reaction: Other reaction-serotonin syn Other Reaction: Other reaction-serotonin syn   Shellfish Allergy Diarrhea, Nausea And Vomiting   Wheat Bran Diarrhea, Nausea And Vomiting      Medication List    TAKE these medications   ARIPiprazole 2 MG tablet Commonly known as:  ABILIFY Take 2 mg by mouth at bedtime.   ciprofloxacin 500 MG  tablet Commonly known as:  CIPRO Take 500 mg by mouth 2 (two) times daily.   diphenoxylate-atropine 2.5-0.025 MG tablet Commonly known as:  LOMOTIL Take 1 tablet by mouth 4 (four) times daily as needed for diarrhea or loose stools.   DULoxetine 30 MG capsule Commonly known as:  CYMBALTA Take 1 capsule (30 mg total) by mouth daily.   FIBER PO Take by mouth. .25-.50 of teaspoon twice a day   ketorolac 10 MG tablet Commonly known as:  TORADOL TK 1 T PO  Q 4-6 HRS FOR PAIN. NOT TO EXCEED 40MG  IN 24 H   oxyCODONE-acetaminophen 5-325 MG tablet Commonly known as:  PERCOCET/ROXICET Take 1 tablet by mouth every 4 (four) hours as needed for severe pain (every 4-6 hours prn).   PROAIR HFA 108 (90 Base) MCG/ACT inhaler Generic drug:  albuterol Inhale 2 puffs into the lungs every 6 (six) hours as needed for wheezing or shortness of breath.   PROBIOTIC PO Take 1 capsule by mouth daily.   promethazine 25 MG tablet Commonly known as:  PHENERGAN Take 25 mg by mouth every 6 (six) hours as needed for nausea or vomiting.   promethazine 12.5 MG suppository Commonly known as:  PHENERGAN Place 12.5 mg rectally every 8 (eight) hours as needed for nausea or vomiting.   sulfamethoxazole-trimethoprim 800-160 MG tablet Commonly known as:  BACTRIM DS,SEPTRA DS Take 1 tablet by mouth 2 (two) times daily. Take one tablet two times daily for 7 days and then one tablet po qhs   tamsulosin 0.4 MG Caps capsule Commonly known as:  FLOMAX Take 0.4 mg by mouth at bedtime.      Follow-up Information  Jerilee FieldEskridge, Michael Walrath, MD Follow up.   Specialty:  Urology Why:  office will call with appointment  Contact information: [address] [place] [id number] [phone]           Signed: Jerilee FieldMatthew Lema Heinkel 12/28/2017, 11:10 AM

## 2017-12-28 NOTE — Anesthesia Postprocedure Evaluation (Signed)
Anesthesia Post Note  Patient: Carolyn Parker  Procedure(s) Performed: CYSTOSCOPY WITH RETROGRADE PYELOGRAM, URETEROSCOPY AND STENT PLACEMENT (Left )     Patient location during evaluation: PACU Anesthesia Type: General Level of consciousness: awake and alert Pain management: pain level controlled Vital Signs Assessment: post-procedure vital signs reviewed and stable Respiratory status: spontaneous breathing, nonlabored ventilation, respiratory function stable and patient connected to nasal cannula oxygen Cardiovascular status: blood pressure returned to baseline and stable Postop Assessment: no apparent nausea or vomiting Anesthetic complications: no    Last Vitals:  Vitals:   12/27/17 2100 12/28/17 1221  BP: 130/87   Pulse: 83   Resp: 16   Temp: 36.9 C 37.3 C  SpO2: 100%     Last Pain:  Vitals:   12/28/17 1221  TempSrc: Oral  PainSc:                  Evalyne Cortopassi S

## 2017-12-28 NOTE — Op Note (Signed)
NAME:  [name], [name]                    ACCOUNT NO.:  MEDICAL RECORD NO.:  [id number]  LOCATION:                                 FACILITY:  PHYSICIAN:  Sebastian Acheheodore Kalan Rinn, MD          DATE OF BIRTH:  DATE OF PROCEDURE: 12/27/2017                               OPERATIVE REPORT   DIAGNOSES: 1. Left ureteral stone. 2. Pyelonephritis. 3. Hydronephrosis.  PROCEDURES: 1. Cystoscopy with left retrograde pyelogram interpretation. 2. Insertion of left ureteral stent, 6 x 24, Contour.  ESTIMATED BLOOD LOSS:  Nil.  COMPLICATION:  None.  SPECIMEN:  None.  FINDINGS: 1. Mild hydroureteronephrosis of the proximal ureter and kidney. 2. Successful placement of left ureteral stent, proximal in the renal     pelvis and distal in the bladder.  INDICATION:  [name] is pleasant 37 year old woman who underwent shockwave lithotripsy yesterday of right proximal ureteral stone.  She unfortunately developed high fevers and subjective malaise earlier today.  She was seen in the office and noted to be febrile to 102.  Her urinalysis was concerning for possible infection.  She underwent urgent renal ultrasound in the office, which also revealed hydronephrosis on the left.  Given the constellation of symptoms, felt that urgent renal decompression was warranted with stenting.  Informed consent was obtained and placed in the medical record.  PROCEDURE IN DETAIL:  The patient being, [name], was verified. Procedure being left ureteral stent placement was confirmed. Procedure was carried out.  Time-out was performed.  Intravenous antibiotics were administered.  General LMA anesthesia was introduced.  The patient was placed into a low lithotomy position.  Sterile field was created by prepping and draping the patient's vagina, introitus, and proximal thighs using iodine.  Next, cystourethroscopy was performed using a 22- French rigid cystoscope with offset lens.  Inspection of the urinary bladder  revealed some diffuse erythema concerning for possible cystitis. The left ureteral orifice was cannulated with 6-French end-hole catheter and left retrograde pyelogram was obtained.  Left retrograde pyelogram demonstrated a single left ureter with single- system left kidney.  There was some mild hydroureteronephrosis to the proximal ureter likely corresponding to site of prior stone impaction. The 0.038 Zip wire was advanced below the upper pole, over which a new 6 x 24 Contour-type stent was placed using cystoscopic and fluoroscopic guidance.  Good proximal and distal deployment were noted.  Efflux of urine was seen around into the distal end of the stent.  Bladder was emptied per cystoscope.  Procedure was then terminated.  The patient tolerated the procedure well.  There were no immediate periprocedural complications.  The patient was taken to the postanesthesia care in stable condition with plan for overnight admission to verify her infectious and parameters improve before discharge.          ______________________________ Sebastian Acheheodore Amillia Biffle, MD     TM/MEDQ  D:  12/27/2017  T:  12/27/2017  Job:  [id number]

## 2018-01-16 ENCOUNTER — Encounter (HOSPITAL_BASED_OUTPATIENT_CLINIC_OR_DEPARTMENT_OTHER): Payer: Self-pay | Admitting: *Deleted

## 2018-01-16 ENCOUNTER — Other Ambulatory Visit: Payer: Self-pay | Admitting: Urology

## 2018-01-16 ENCOUNTER — Other Ambulatory Visit: Payer: Self-pay

## 2018-01-16 NOTE — Progress Notes (Signed)
SPOKE W/ PT VIA PHONE FOR PRE-OP INTERVIEW.  NPO AFTER MN.  ARRIVE AT [phone].  CURRENT LAB RESULTS IN CHART AND Epic.  WILL TAKE CYMBALTA AM DOS W/ SIPS OF WATER AND NEEDED TAKE TYLENOL/ PHENERGAN.

## 2018-01-16 NOTE — Anesthesia Preprocedure Evaluation (Addendum)
Anesthesia Evaluation  Patient identified by MRN, date of birth, ID band Patient awake    Reviewed: Allergy & Precautions, NPO status , Patient's Chart, lab work & pertinent test results  Airway Mallampati: II  TM Distance: >3 FB Neck ROM: Full    Dental no notable dental hx.    Pulmonary asthma , former smoker,    Pulmonary exam normal breath sounds clear to auscultation       Cardiovascular negative cardio ROS Normal cardiovascular exam Rhythm:Regular Rate:Normal     Neuro/Psych negative neurological ROS     GI/Hepatic negative GI ROS, Neg liver ROS,   Endo/Other    Renal/GU      Musculoskeletal   Abdominal   Peds  Hematology   Anesthesia Other Findings   Reproductive/Obstetrics                            Anesthesia Physical Anesthesia Plan  ASA: II  Anesthesia Plan: General   Post-op Pain Management:    Induction: Intravenous  PONV Risk Score and Plan: Treatment may vary due to age or medical condition  Airway Management Planned: LMA  Additional Equipment:   Intra-op Plan:   Post-operative Plan:   Informed Consent: I have reviewed the patients History and Physical, chart, labs and discussed the procedure including the risks, benefits and alternatives for the proposed anesthesia with the patient or authorized representative who has indicated his/her understanding and acceptance.     Plan Discussed with: CRNA  Anesthesia Plan Comments:         Anesthesia Quick Evaluation

## 2018-01-17 ENCOUNTER — Ambulatory Visit (HOSPITAL_BASED_OUTPATIENT_CLINIC_OR_DEPARTMENT_OTHER): Payer: BLUE CROSS/BLUE SHIELD | Admitting: Anesthesiology

## 2018-01-17 ENCOUNTER — Encounter (HOSPITAL_BASED_OUTPATIENT_CLINIC_OR_DEPARTMENT_OTHER)
Admission: RE | Disposition: A | Discharge status: Home / Self Care | Payer: Self-pay | Source: Ambulatory Visit | Attending: Urology

## 2018-01-17 ENCOUNTER — Ambulatory Visit (HOSPITAL_BASED_OUTPATIENT_CLINIC_OR_DEPARTMENT_OTHER)
Admission: RE | Admit: 2018-01-17 | Discharge: 2018-01-17 | Disposition: A | Discharge status: Home / Self Care | Payer: BLUE CROSS/BLUE SHIELD | Source: Ambulatory Visit | Attending: Urology | Admitting: Urology

## 2018-01-17 ENCOUNTER — Encounter (HOSPITAL_BASED_OUTPATIENT_CLINIC_OR_DEPARTMENT_OTHER): Payer: Self-pay | Admitting: *Deleted

## 2018-01-17 DIAGNOSIS — Z79891 Long term (current) use of opiate analgesic: Secondary | ICD-10-CM | POA: Insufficient documentation

## 2018-01-17 DIAGNOSIS — Z79899 Other long term (current) drug therapy: Secondary | ICD-10-CM | POA: Insufficient documentation

## 2018-01-17 DIAGNOSIS — N201 Calculus of ureter: Secondary | ICD-10-CM | POA: Diagnosis present

## 2018-01-17 DIAGNOSIS — N132 Hydronephrosis with renal and ureteral calculous obstruction: Secondary | ICD-10-CM | POA: Diagnosis not present

## 2018-01-17 DIAGNOSIS — F419 Anxiety disorder, unspecified: Secondary | ICD-10-CM | POA: Insufficient documentation

## 2018-01-17 DIAGNOSIS — N1339 Other hydronephrosis: Secondary | ICD-10-CM

## 2018-01-17 DIAGNOSIS — Z87891 Personal history of nicotine dependence: Secondary | ICD-10-CM | POA: Insufficient documentation

## 2018-01-17 DIAGNOSIS — F329 Major depressive disorder, single episode, unspecified: Secondary | ICD-10-CM | POA: Insufficient documentation

## 2018-01-17 HISTORY — PX: CYSTOSCOPY/URETEROSCOPY/HOLMIUM LASER/STENT PLACEMENT: SHX6546

## 2018-01-17 HISTORY — DX: Other complications of anesthesia, initial encounter: T88.59XA

## 2018-01-17 HISTORY — DX: Major depressive disorder, single episode, unspecified: F32.9

## 2018-01-17 HISTORY — DX: Post-traumatic stress disorder, chronic: F43.12

## 2018-01-17 HISTORY — DX: Adverse effect of unspecified anesthetic, initial encounter: T41.45XA

## 2018-01-17 HISTORY — DX: Unspecified asthma, uncomplicated: J45.909

## 2018-01-17 HISTORY — DX: Unspecified hydronephrosis: N13.30

## 2018-01-17 HISTORY — DX: Constipation, unspecified: K59.00

## 2018-01-17 HISTORY — DX: Calculus of ureter: N20.1

## 2018-01-17 LAB — POCT PREGNANCY, URINE: Preg Test, Ur: NEGATIVE

## 2018-01-17 SURGERY — CYSTOSCOPY/URETEROSCOPY/HOLMIUM LASER/STENT PLACEMENT
Anesthesia: General | Site: Bladder | Laterality: Left

## 2018-01-17 MED ORDER — MIDAZOLAM HCL 2 MG/2ML IJ SOLN
INTRAMUSCULAR | Status: AC
  Filled 2018-01-17: qty 2

## 2018-01-17 MED ORDER — ONDANSETRON HCL 4 MG/2ML IJ SOLN
INTRAMUSCULAR | Status: AC
  Filled 2018-01-17: qty 2

## 2018-01-17 MED ORDER — GABAPENTIN 300 MG PO CAPS
ORAL_CAPSULE | ORAL | Status: AC
  Filled 2018-01-17: qty 1

## 2018-01-17 MED ORDER — PROPOFOL 10 MG/ML IV BOLUS
INTRAVENOUS | Status: DC | PRN
  Administered 2018-01-17: 160 mg via INTRAVENOUS

## 2018-01-17 MED ORDER — GABAPENTIN 300 MG PO CAPS
300.0000 mg | ORAL_CAPSULE | Freq: Once | ORAL | Status: AC
Start: 2018-01-17 — End: 2018-01-17
  Administered 2018-01-17: 300 mg via ORAL
  Filled 2018-01-17: qty 1

## 2018-01-17 MED ORDER — FENTANYL CITRATE (PF) 100 MCG/2ML IJ SOLN
INTRAMUSCULAR | Status: DC | PRN
  Administered 2018-01-17 (×3): 25 ug via INTRAVENOUS

## 2018-01-17 MED ORDER — STERILE WATER FOR IRRIGATION IR SOLN
Status: DC | PRN
  Administered 2018-01-17: 3000 mL

## 2018-01-17 MED ORDER — ACETAMINOPHEN 500 MG PO TABS
1000.0000 mg | ORAL_TABLET | Freq: Once | ORAL | Status: AC
  Administered 2018-01-17: 1000 mg via ORAL
  Filled 2018-01-17: qty 2

## 2018-01-17 MED ORDER — DEXAMETHASONE SODIUM PHOSPHATE 10 MG/ML IJ SOLN
INTRAMUSCULAR | Status: AC
  Filled 2018-01-17: qty 1

## 2018-01-17 MED ORDER — SCOPOLAMINE 1 MG/3DAYS TD PT72
1.0000 | MEDICATED_PATCH | TRANSDERMAL | Status: DC
  Administered 2018-01-17: 1.5 mg via TRANSDERMAL
  Filled 2018-01-17: qty 1

## 2018-01-17 MED ORDER — MIDAZOLAM HCL 5 MG/5ML IJ SOLN
INTRAMUSCULAR | Status: DC | PRN
  Administered 2018-01-17: 2 mg via INTRAVENOUS

## 2018-01-17 MED ORDER — ACETAMINOPHEN 500 MG PO TABS
ORAL_TABLET | ORAL | Status: AC
  Filled 2018-01-17: qty 2

## 2018-01-17 MED ORDER — SCOPOLAMINE 1 MG/3DAYS TD PT72
MEDICATED_PATCH | TRANSDERMAL | Status: AC
  Filled 2018-01-17: qty 1

## 2018-01-17 MED ORDER — FENTANYL CITRATE (PF) 100 MCG/2ML IJ SOLN
INTRAMUSCULAR | Status: AC
  Filled 2018-01-17: qty 2

## 2018-01-17 MED ORDER — LIDOCAINE 2% (20 MG/ML) 5 ML SYRINGE
INTRAMUSCULAR | Status: DC | PRN
  Administered 2018-01-17: 70 mg via INTRAVENOUS

## 2018-01-17 MED ORDER — CIPROFLOXACIN IN D5W 400 MG/200ML IV SOLN
400.0000 mg | Freq: Once | INTRAVENOUS | Status: DC
  Filled 2018-01-17: qty 200

## 2018-01-17 MED ORDER — CIPROFLOXACIN IN D5W 400 MG/200ML IV SOLN
INTRAVENOUS | Status: DC | PRN
  Administered 2018-01-17: 400 mg via INTRAVENOUS

## 2018-01-17 MED ORDER — LACTATED RINGERS IV SOLN
INTRAVENOUS | Status: DC
  Administered 2018-01-17: 11:00:00 via INTRAVENOUS
  Filled 2018-01-17: qty 1000

## 2018-01-17 MED ORDER — ONDANSETRON HCL 4 MG/2ML IJ SOLN
INTRAMUSCULAR | Status: DC | PRN
  Administered 2018-01-17: 4 mg via INTRAVENOUS

## 2018-01-17 MED ORDER — CIPROFLOXACIN IN D5W 400 MG/200ML IV SOLN
INTRAVENOUS | Status: AC
  Filled 2018-01-17: qty 200

## 2018-01-17 MED ORDER — DEXAMETHASONE SODIUM PHOSPHATE 10 MG/ML IJ SOLN
INTRAMUSCULAR | Status: DC | PRN
  Administered 2018-01-17: 10 mg via INTRAVENOUS

## 2018-01-17 MED ORDER — PROPOFOL 10 MG/ML IV BOLUS
INTRAVENOUS | Status: AC
  Filled 2018-01-17: qty 20

## 2018-01-17 MED ORDER — LIDOCAINE 2% (20 MG/ML) 5 ML SYRINGE
INTRAMUSCULAR | Status: AC
  Filled 2018-01-17: qty 5

## 2018-01-17 SURGICAL SUPPLY — 29 items
BAG DRAIN URO-CYSTO SKYTR STRL (DRAIN) ×2 IMPLANT
BAG DRN UROCATH (DRAIN) ×1
BASKET LASER NITINOL 1.9FR (BASKET) IMPLANT
BASKET ZERO TIP NITINOL 2.4FR (BASKET) IMPLANT
BSKT STON RTRVL 120 1.9FR (BASKET)
BSKT STON RTRVL ZERO TP 2.4FR (BASKET)
CATH URET 5FR 28IN CONE TIP (BALLOONS)
CATH URET 5FR 28IN OPEN ENDED (CATHETERS) IMPLANT
CATH URET 5FR 70CM CONE TIP (BALLOONS) IMPLANT
CATH URET DUAL LUMEN 6-10FR 50 (CATHETERS) IMPLANT
CLOTH BEACON ORANGE TIMEOUT ST (SAFETY) ×2 IMPLANT
FIBER LASER FLEXIVA 365 (UROLOGICAL SUPPLIES) IMPLANT
FIBER LASER TRAC TIP (UROLOGICAL SUPPLIES) IMPLANT
GLOVE BIO SURGEON STRL SZ7.5 (GLOVE) ×2 IMPLANT
GOWN STRL REUS W/TWL LRG LVL3 (GOWN DISPOSABLE) ×2 IMPLANT
GOWN STRL REUS W/TWL XL LVL3 (GOWN DISPOSABLE) ×1 IMPLANT
GUIDEWIRE 0.038 PTFE COATED (WIRE) IMPLANT
GUIDEWIRE ANG ZIPWIRE 038X150 (WIRE) ×1 IMPLANT
GUIDEWIRE STR DUAL SENSOR (WIRE) ×2 IMPLANT
INFUSOR MANOMETER BAG 3000ML (MISCELLANEOUS) ×2 IMPLANT
IV NS IRRIG 3000ML ARTHROMATIC (IV SOLUTION) ×4 IMPLANT
KIT TURNOVER CYSTO (KITS) ×2 IMPLANT
MANIFOLD NEPTUNE II (INSTRUMENTS) ×1 IMPLANT
NS IRRIG 500ML POUR BTL (IV SOLUTION) ×2 IMPLANT
PACK CYSTO (CUSTOM PROCEDURE TRAY) ×2 IMPLANT
SHEATH URETERAL 12FRX35CM (MISCELLANEOUS) ×1 IMPLANT
STENT URET 6FRX24 CONTOUR (STENTS) ×1 IMPLANT
TUBE CONNECTING 12X1/4 (SUCTIONS) ×1 IMPLANT
WATER STERILE IRR 3000ML UROMA (IV SOLUTION) ×1 IMPLANT

## 2018-01-17 NOTE — Anesthesia Postprocedure Evaluation (Signed)
Anesthesia Post Note  Patient: Carolyn Parker  Procedure(s) Performed: CYSTOSCOPY/URETEROSCOPY/STENT EXCHANGE (Left Bladder)     Patient location during evaluation: PACU Anesthesia Type: General Level of consciousness: awake and alert Pain management: pain level controlled Vital Signs Assessment: post-procedure vital signs reviewed and stable Respiratory status: spontaneous breathing, nonlabored ventilation, respiratory function stable and patient connected to nasal cannula oxygen Cardiovascular status: blood pressure returned to baseline and stable Postop Assessment: no apparent nausea or vomiting Anesthetic complications: no    Last Vitals:  Vitals:   01/17/18 0939 01/17/18 1245  BP: 135/90 (!) 158/99  Pulse: 80 (!) 101  Resp: 17 10  Temp: 36.6 C 36.4 C  SpO2: 98% 94%    Last Pain:  Vitals:   01/17/18 0939  TempSrc: Oral                 Trevor IhaStephen A Kaaren Nass

## 2018-01-17 NOTE — Transfer of Care (Signed)
Immediate Anesthesia Transfer of Care Note  Patient: Carolyn Parker  Procedure(s) Performed: CYSTOSCOPY/URETEROSCOPY/STENT EXCHANGE (Left Bladder)  Patient Location: PACU  Anesthesia Type:General  Level of Consciousness: awake, alert , oriented, drowsy and patient cooperative  Airway & Oxygen Therapy: Patient Spontanous Breathing and Patient connected to nasal cannula oxygen  Post-op Assessment: Report given to RN and Post -op Vital signs reviewed and stable  Post vital signs: Reviewed and stable  Last Vitals:  Vitals:   01/17/18 0939 01/17/18 1245  BP: 135/90 (!) 158/99  Pulse: 80 (!) 101  Resp: 17   Temp: 36.6 C 36.4 C  SpO2: 98% 94%    Last Pain:  Vitals:   01/17/18 0939  TempSrc: Oral         Complications: No apparent anesthesia complications

## 2018-01-17 NOTE — Discharge Instructions (Signed)
Ureteral Stent Implantation, Care After Refer to this sheet in the next few weeks. These instructions provide you with information about caring for yourself after your procedure. Your health care provider may also give you more specific instructions. Your treatment has been planned according to current medical practices, but problems sometimes occur. Call your health care provider if you have any problems or questions after your procedure.  Remove the stent by pulling the string with slow steady pressure on Thursday morning January 19, 2018.  What can I expect after the procedure? After the procedure, it is common to have:  Nausea.  Mild pain when you urinate. You may feel this pain in your lower back or lower abdomen. Pain should stop within a few minutes after you urinate. This may last for up to 1 week.  A small amount of blood in your urine for several days.  Follow these instructions at home:  Medicines  Take over-the-counter and prescription medicines only as told by your health care provider.  If you were prescribed an antibiotic medicine, take it as told by your health care provider. Do not stop taking the antibiotic even if you start to feel better.  Do not drive for 24 hours if you received a sedative.  Do not drive or operate heavy machinery while taking prescription pain medicines. Activity  Return to your normal activities as told by your health care provider. Ask your health care provider what activities are safe for you.  Do not lift anything that is heavier than 10 lb (4.5 kg). Follow this limit for 1 week after your procedure, or for as long as told by your health care provider. General instructions  Watch for any blood in your urine. Call your health care provider if the amount of blood in your urine increases.  If you have a catheter: ? Follow instructions from your health care provider about taking care of your catheter and collection bag. ? Do not take  baths, swim, or use a hot tub until your health care provider approves.  Drink enough fluid to keep your urine clear or pale yellow.  Keep all follow-up visits as told by your health care provider. This is important. Contact a health care provider if:  You have pain that gets worse or does not get better with medicine, especially pain when you urinate.  You have difficulty urinating.  You feel nauseous or you vomit repeatedly during a period of more than 2 days after the procedure. Get help right away if:  Your urine is dark red or has blood clots in it.  You are leaking urine (have incontinence).  The end of the stent comes out of your urethra.  You cannot urinate.  You have sudden, sharp, or severe pain in your abdomen or lower back.  You have a fever. This information is not intended to replace advice given to you by your health care provider. Make sure you discuss any questions you have with your health care provider. Document Released: 06/20/2013 Document Revised: 03/25/2016 Document Reviewed: 05/02/2015 Elsevier Interactive Patient Education  2018 [employer]   Post Anesthesia Home Care Instructions  Activity: Get plenty of rest for the remainder of the day. A responsible individual must stay with you for 24 hours following the procedure.  For the next 24 hours, DO NOT: -Drive a car -Advertising copywriterperate machinery -Drink alcoholic beverages -Take any medication unless instructed by your physician -Make any legal decisions or sign important papers.  Meals: Start  with liquid foods such as gelatin or soup. Progress to regular foods as tolerated. Avoid greasy, spicy, heavy foods. If nausea and/or vomiting occur, drink only clear liquids until the nausea and/or vomiting subsides. Call your physician if vomiting continues.  Special Instructions/Symptoms: Your throat may feel dry or sore from the anesthesia or the breathing tube placed in your throat during surgery. If this causes  discomfort, gargle with warm salt water. The discomfort should disappear within 24 hours.  If you had a scopolamine patch placed behind your ear for the management of post- operative nausea and/or vomiting:  1. The medication in the patch is effective for 72 hours, after which it should be removed.  Wrap patch in a tissue and discard in the trash. Wash hands thoroughly with soap and water. 2. You may remove the patch earlier than 72 hours if you experience unpleasant side effects which may include dry mouth, dizziness or visual disturbances. 3. Avoid touching the patch. Wash your hands with soap and water after contact with the patch.

## 2018-01-17 NOTE — Op Note (Signed)
Preoperative diagnosis: Left hydronephrosis, fever Postoperative diagnosis: Left hydronephrosis  Procedure: Cystoscopy with left ureteroscopy, left ureteral stent exchange  Surgeon: Mena GoesEskridge  Anesthesia: General  Indication for procedure: 37 year old who had a 4-5 mm left proximal ureteral stone.  She underwent shockwave lithotripsy and developed postop fever.  Ultrasound revealed left hydronephrosis and she underwent an urgent stent.  We discussed simply removing the stent but she was brought today for ureteroscopy to ensure no significant fragments remained.  Findings: Cystoscopy and ureteroscopy were unremarkable other than developing Randall's plaques.  No significant stones or stone fragments were noted.  The ureter looked normal.  Description of procedure: After consent was obtained the patient brought to the operating room.  After adequate anesthesia she was placed in lithotomy position and prepped and draped in the usual sterile fashion.  A timeout was performed to confirm the patient and procedure.  The cystoscope was passed per urethra.  The urine was clear.  The left ureteral stent was grasped and removed through the urethral meatus and a guidewire was advanced through this.  I then carefully inspected the left ureter with a semirigid ureteroscope up into the proximal ureter.  I cannot quite get to the UPJ and therefore the scope was carefully backed out.  I used an access sheath to place 2 wires and placed the access sheath adjacent to 1 of the wires leaving it as a safety.  A digital ureteroscope was advanced into the proximal ureter and the remainder of the ureter was normal.  I inspected the collecting system 3 times and noted no significant stone or stone fragment.  There were scattered Randall's plaques and early stone formation under the mucosa.  The collecting system was inspected one last time as well as the renal pelvis and proximal ureter and then the access sheath and the  ureteroscope were backed out together and the ureter noted to be normal.  I did place a 624 stent after back loading the wire on the cystoscope which I will have her remove in a couple of days.  The bladder was drained and the scope removed.  She was awakened and taken to the recovery room in stable condition.  Complications: None  Blood loss: None  Specimens: None  Drains: 6 x 24 cm left ureteral stent with string

## 2018-01-17 NOTE — Anesthesia Postprocedure Evaluation (Signed)
Anesthesia Post Note  Patient: Carolyn Parker  Procedure(s) Performed: CYSTOSCOPY/URETEROSCOPY/STENT EXCHANGE (Left Bladder)     Anesthesia Post Evaluation  Last Vitals:  Vitals:   01/17/18 0939 01/17/18 1245  BP: 135/90 (!) 158/99  Pulse: 80 (!) 101  Resp: 17 10  Temp: 36.6 C 36.4 C  SpO2: 98% 94%    Last Pain:  Vitals:   01/17/18 0939  TempSrc: Oral                 Trevor IhaStephen A Houser

## 2018-01-17 NOTE — Interval H&P Note (Signed)
History and Physical Interval Note:  01/17/2018 11:48 AM  Carolyn Parker  has presented today for surgery, with the diagnosis of LEFT HYDRONEPHROSIS, LEFT URETERAL STONE  The various methods of treatment have been discussed with the patient and family. After consideration of risks, benefits and other options for treatment, the patient has consented to  Procedure(s): CYSTOSCOPY/URETEROSCOPY/HOLMIUM LASER/STENT EXCHANGE (Left) as a surgical intervention .  Following her left shockwave lithotripsy she developed fever.  Retrograde pyelogram at the time of an urgent stent revealed left hydro-nephrosis of the proximal ureter and collecting system.  No definite stone.  I discussed with the patient and her husband simply removing the stent or ureteroscopy to ensure no large fragments.  All questions answered.  They elected to proceed with ureteroscopy.  I had her on Cipro over the weekend and she has had no fever or dysuria.  The patient's history has been reviewed, patient examined, no change in status, stable for surgery.  I have reviewed the patient's chart and labs.  Questions were answered to the patient's satisfaction.     Jerilee FieldMatthew Magdaleno Lortie

## 2018-01-17 NOTE — Anesthesia Procedure Notes (Signed)
Procedure Name: LMA Insertion Date/Time: 01/17/2018 12:10 PM Performed by: Yolonda Kidaarver, Savonna Birchmeier L, CRNA Pre-anesthesia Checklist: Patient identified, Emergency Drugs available, Suction available and Patient being monitored Patient Re-evaluated:Patient Re-evaluated prior to induction Oxygen Delivery Method: Circle system utilized Preoxygenation: Pre-oxygenation with 100% oxygen Induction Type: IV induction LMA: LMA inserted LMA Size: 4.0 Number of attempts: 1 Placement Confirmation: positive ETCO2,  CO2 detector and breath sounds checked- equal and bilateral Tube secured with: Tape Dental Injury: Teeth and Oropharynx as per pre-operative assessment

## 2018-01-18 ENCOUNTER — Encounter (HOSPITAL_BASED_OUTPATIENT_CLINIC_OR_DEPARTMENT_OTHER): Payer: Self-pay | Admitting: Urology

## 2018-02-06 ENCOUNTER — Encounter: Payer: Self-pay | Admitting: Internal Medicine

## 2018-02-06 ENCOUNTER — Ambulatory Visit: Payer: BLUE CROSS/BLUE SHIELD | Admitting: Internal Medicine

## 2018-02-06 VITALS — BP 124/84 | HR 87 | Ht 60.75 in | Wt 150.8 lb

## 2018-02-06 DIAGNOSIS — K580 Irritable bowel syndrome with diarrhea: Secondary | ICD-10-CM

## 2018-02-06 DIAGNOSIS — R110 Nausea: Secondary | ICD-10-CM | POA: Diagnosis not present

## 2018-02-06 DIAGNOSIS — Z800 Family history of malignant neoplasm of digestive organs: Secondary | ICD-10-CM

## 2018-02-06 MED ORDER — ONDANSETRON HCL 4 MG PO TABS: 4.0000 mg | ORAL_TABLET | Freq: Three times a day (TID) | ORAL | 1 refills | Status: DC | PRN

## 2018-02-06 NOTE — Progress Notes (Signed)
   Subjective:    Patient ID: Carolyn Parker, female    DOB: 12/09/1980, 37 y.o.   MRN: [id number]  HPI Carolyn Parker is a 37 year old female with a past medical history of IBS with diarrhea, chronic nausea, probable alpha gal, family history of colon cancer and anxiety and depression who is here for follow-up.  She was initially seen in November 2018 by Doug SouJessica Zehr, PA-C to evaluate diarrhea abdominal pain, loss of appetite and nausea.  She is here alone today.  Her first visit led to a colonoscopy which was performed on 10/06/2017.  This was normal with biopsies that were normal.  She had a negative celiac panel and normal CRP.  After her colonoscopy she was treated with rifaximin 550 mg 3 times daily times 14 days which she tolerated well.  This has completely normalized her bowel habits.  She has had no further diarrhea or issues with constipation.  No abdominal pain.  Her biggest complaint continues to be chronic nausea but no vomiting.  Tums seems to help her nausea somewhat.  In the past though not recently she is tried medication such as Nexium which did not help her nausea.  She does not have heartburn or reflux symptoms.  She is tried Phenergan for nausea which helps but makes her too sleepy.  She states that the nausea tends to be worse in the morning.  Some days she is just "a little bit queasy" while other days nausea seems to be worse.  Some days she has no nausea.  No fevers or chills.  She has been dealing with kidney stones recently and working with urology to this regard.  Review of Systems As per HPI, otherwise negative  Current Medications, Allergies, Past Medical History, Past Surgical History, Family History and Social History were reviewed in [facility] electronic medical record.     Objective:   Physical Exam BP 124/84   Pulse 87   Ht 5' 0.75" (1.543 m)   Wt 150 lb 12.8 oz (68.4 kg)   LMP 01/11/2018 (Approximate)   SpO2 98%   BMI 28.73 kg/m  Constitutional:  Well-developed and well-nourished. No distress. HEENT: Normocephalic and atraumatic. Marland Kitchen.  No scleral icterus. Neurological: Alert and oriented to person place and time. Skin: Skin is warm and dry. Psychiatric: Normal mood and affect. Behavior is normal.      Assessment & Plan:  37 year old female with a past medical history of IBS with diarrhea, chronic nausea, probable alpha gal, family history of colon cancer and anxiety and depression who is here for follow-up.  1.  IBS-D --significant improvement with rifaximin.  Symptoms currently in remission.  We discussed how she may need retreatment with rifaximin in the future.   2. Chronic nausea --on and off an issue for her, Phenergan making her too sleepy.  Stop Phenergan.  Symptoms seem better with Tums which raises the question of acid peptic etiology.  For this I am going to have her try ranitidine 150 mg every morning and at bedtime for about a month.  Call after 1 month tell me if nausea is better.  Can also use Zofran 4 mg every 8 hours as needed for moderate to severe nausea.  3.  Family history of colon cancer --valence colonoscopy recommended at the 5-year mark which would be December 2023  25 minutes spent with the patient today. Greater than 50% was spent in counseling and coordination of care with the patient

## 2018-02-06 NOTE — Patient Instructions (Addendum)
If you are age 35 or older, your body mass index should be between 23-30. Your Body mass index is 28.73 kg/m. If this is out of the aforementioned range listed, please consider follow up with your Primary Care Provider.  If you are age 35 or younger, your body mass index should be between 19-25. Your Body mass index is 28.73 kg/m. If this is out of the aformentioned range listed, please consider follow up with your Primary Care Provider.   Please purchase the following medications over the counter and take as directed: Zantac 150mg  every morning and at bedtime for one month.  We have sent the following medications to your pharmacy for you to pick up at your convenience: Zofran 4mg  every 8 hours as needed for nausea.  Follow-up in 6 months.  Thank you for choosing [facility], Dr. Rhea BeltonPyrtle

## 2018-03-06 ENCOUNTER — Ambulatory Visit: Payer: BLUE CROSS/BLUE SHIELD | Admitting: Psychiatry

## 2018-03-08 ENCOUNTER — Other Ambulatory Visit: Payer: Self-pay | Admitting: Internal Medicine

## 2018-03-09 ENCOUNTER — Ambulatory Visit: Payer: Self-pay | Admitting: Psychiatry

## 2018-03-09 ENCOUNTER — Other Ambulatory Visit: Payer: Self-pay

## 2018-03-09 ENCOUNTER — Encounter: Payer: Self-pay | Admitting: Psychiatry

## 2018-03-09 ENCOUNTER — Ambulatory Visit: Payer: BLUE CROSS/BLUE SHIELD | Admitting: Psychiatry

## 2018-03-09 VITALS — BP 128/83 | HR 82 | Temp 99.0°F | Wt 148.8 lb

## 2018-03-09 DIAGNOSIS — F331 Major depressive disorder, recurrent, moderate: Secondary | ICD-10-CM | POA: Diagnosis not present

## 2018-03-09 DIAGNOSIS — Z0120 Encounter for dental examination and cleaning without abnormal findings: Secondary | ICD-10-CM | POA: Insufficient documentation

## 2018-03-09 DIAGNOSIS — F4312 Post-traumatic stress disorder, chronic: Secondary | ICD-10-CM | POA: Diagnosis not present

## 2018-03-09 MED ORDER — ARIPIPRAZOLE 2 MG PO TABS: 2.0000 mg | ORAL_TABLET | Freq: Every day | ORAL | 1 refills | Status: DC

## 2018-03-09 MED ORDER — DULOXETINE HCL 30 MG PO CPEP: 30.0000 mg | ORAL_CAPSULE | Freq: Every morning | ORAL | 1 refills | Status: DC
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
## 2018-04-27 ENCOUNTER — Ambulatory Visit: Payer: BLUE CROSS/BLUE SHIELD | Admitting: Gastroenterology

## 2018-04-27 ENCOUNTER — Encounter: Payer: Self-pay | Admitting: Gastroenterology

## 2018-04-27 ENCOUNTER — Telehealth: Payer: Self-pay | Admitting: Gastroenterology

## 2018-04-27 VITALS — BP 94/64 | HR 84 | Ht 60.75 in | Wt 149.2 lb

## 2018-04-27 DIAGNOSIS — K580 Irritable bowel syndrome with diarrhea: Secondary | ICD-10-CM | POA: Diagnosis not present

## 2018-04-27 DIAGNOSIS — R110 Nausea: Secondary | ICD-10-CM | POA: Diagnosis not present

## 2018-04-27 MED ORDER — RIFAXIMIN 550 MG PO TABS: 550.0000 mg | ORAL_TABLET | Freq: Three times a day (TID) | ORAL | 0 refills | Status: AC

## 2018-04-27 NOTE — Telephone Encounter (Signed)
Pharmacy called to let you know that they received your fax regarding Xifaxan and they are working on [name].

## 2018-04-27 NOTE — Patient Instructions (Signed)
[place] your [place].   We have sent your demographic information and a prescription for Xifaxan to [facility]. This pharmacy is able to get medication approved through insurance and get you the lowest copay possible. If you have not heard from them within 1 week, please call our office at [phone] to let us know.  Then start Florastor daily (over the counter)

## 2018-04-27 NOTE — Progress Notes (Addendum)
04/27/2018 Carolyn Parker [id number] 05/16/1981   HISTORY OF PRESENT ILLNESS:  Carolyn Parker is a 37 year old female with a past medical history of IBS with diarrhea, chronic nausea, probable alpha gal, family history of colon cancer and anxiety and depression, and PTSD who is here for follow-up.  She was initially seen in November 2018 by myself to evaluate diarrhea, abdominal pain, loss of appetite and nausea.  She is here alone today.   Her first visit led to a colonoscopy which was performed on 10/06/2017.  This was normal with biopsies that were normal.  She had a negative celiac panel and normal CRP.   After her colonoscopy she was treated with rifaximin 550 mg 3 times daily times 14 days which she tolerated well and completely normalized her bowel habits.  She was last seen here by Dr. Rhea BeltonPyrtle in April.  Was doing better at that time except for some ongoing nausea.    Returns today with ongoing nausea and recurrent diarrhea.  Is taking the zofran every morning and makes the nausea tolerable for the most part, but it is still there.  Has tried OTC PPI and H2 blockers again without improvement.  Still vomits occasionally as well.  Has recurrent diarrhea.  She has been on antibiotics almost continuously for the past 2 years for UTI's.  Is currently on omnicef.  She also tells me that she smokes a lot of marijuana and uses a lot of CBD oil.  Says that she tried not smoking marijuana for a month and she felt worse, nausea was much worse.     Past Medical History:  Diagnosis Date  . Anxiety   . Chronic post-traumatic stress disorder (PTSD)    followed by dr Garnetta Buddyfaheem  . Complication of anesthesia    hard to wake  . Constipation   . Family history of breast cancer   . Genetic testing 12/2013   My Risk neg   . History of kidney stones   . Hydronephrosis, left   . Kidney stone   . Left ureteral stone   . Major depressive disorder    followed by dr Garnetta Buddyfaheem [facility]  . Mild asthma    Past  Surgical History:  Procedure Laterality Date  . COLONOSCOPY  10-06-2017  dr pyrtle  . CYSTOSCOPY WITH RETROGRADE PYELOGRAM, URETEROSCOPY AND STENT PLACEMENT Left 12/27/2017   Procedure: CYSTOSCOPY WITH RETROGRADE PYELOGRAM, URETEROSCOPY AND STENT PLACEMENT;  Surgeon: Sebastian AcheManny, Theodore, MD;  Location: [facility];  Service: Urology;  Laterality: Left;  . CYSTOSCOPY/URETEROSCOPY/HOLMIUM LASER/STENT PLACEMENT Left 01/17/2018   Procedure: CYSTOSCOPY/URETEROSCOPY/STENT EXCHANGE;  Surgeon: Jerilee FieldEskridge, Matthew, MD;  [facility] [place];  Service: Urology;  Laterality: Left;  . ESSURE TUBAL LIGATION Bilateral 2012  . EXTRACORPOREAL SHOCK WAVE LITHOTRIPSY Left 12/26/2017   Procedure: LEFT EXTRACORPOREAL SHOCK WAVE LITHOTRIPSY (ESWL);  Surgeon: Hildred LaserBudzyn, Brian James, MD;  Location: [facility];  Service: Urology;  Laterality: Left;  . LAPAROSCOPIC ENDOMETRIOSIS FULGURATION  2006    reports that she quit smoking about 7 years ago. Her smoking use included cigarettes. She started smoking about 17 years ago. She quit after 10.00 years of use. She has never used smokeless tobacco. She reports that she drinks alcohol. She reports that she does not use drugs. family history includes Anxiety disorder in her mother; Bipolar disorder in her sister; Breast cancer (age of onset: 2230) in her maternal aunt; Colon cancer (age of onset: 5355) in her mother; Colon cancer (age of onset: 8080) in her maternal  grandmother; Depression in her mother; Diabetes in her mother; Heart Problems in her mother; Hypertension in her father. Allergies  Allergen Reactions  . Bee Venom Anaphylaxis    Uncoded Allergy. Allergen: bee sting  . Amoxicillin-Pot Clavulanate Nausea And Vomiting  . Beef-Derived Products Diarrhea and Nausea And Vomiting  . Dairy Aid [Lactase] Diarrhea and Nausea And Vomiting  . Pork-Derived Products Diarrhea and Nausea And Vomiting  . Sertraline Other (See Comments)    Other Reaction: -serotonin syndrome   . Shellfish  Allergy Diarrhea and Nausea And Vomiting  . Wheat Bran Diarrhea and Nausea And Vomiting      Outpatient Encounter Medications as of 04/27/2018  Medication Sig  . ARIPiprazole (ABILIFY) 2 MG tablet Take 1 tablet (2 mg total) by mouth at bedtime.  . cefdinir (OMNICEF) 300 MG capsule Take 1 capsule by mouth 2 (two) times daily.  . DULoxetine (CYMBALTA) 30 MG capsule Take 1 capsule (30 mg total) by mouth every morning.  . montelukast (SINGULAIR) 10 MG tablet Take by mouth.  . trimethoprim (TRIMPEX) 100 MG tablet Take 1 tablet by mouth daily.   No facility-administered encounter medications on file as of 04/27/2018.      REVIEW OF SYSTEMS  : All other systems reviewed and negative except where noted in the History of Present Illness.   PHYSICAL EXAM: BP 94/64 (BP Location: Left Arm, Patient Position: Sitting, Cuff Size: Normal)   Pulse 84   Ht 5' 0.75" (1.543 m)   Wt 149 lb 4 oz (67.7 kg)   LMP 04/06/2018   BMI 28.43 kg/m  General: Well developed white female in no acute distress Head: Normocephalic and atraumatic Eyes:  Sclerae anicteric, conjunctiva pink. Ears: Normal auditory acuity Lungs: Clear throughout to auscultation; no increased WOB. Heart: Regular rate and rhythm; no M/R/G. Abdomen: Soft, non-distended.  BS present.  Non-tender. Musculoskeletal: Symmetrical with no gross deformities  Skin: No lesions on visible extremities Extremities: No edema  Neurological: Alert oriented x 4, grossly non-focal Psychological:  Alert and cooperative. Normal mood and affect  ASSESSMENT AND PLAN: 37 year old female with a past medical history of IBS with diarrhea, chronic nausea, probable alpha gal, family history of colon cancer and anxiety and depression/PTSD who is here for follow-up.   1.  IBS-D --significant improvement previously with course of rifaximin.  Symptoms have returned.  She has been on antibiotics almost constantly for UTI's for the past couple of years.  Currently on  omnicef.  These could certainly be causing or contributing to her issues, side effects vs wiping out normal gut flora, etc.     2. Chronic nausea --on and off an issue for her, Phenergan making her too sleepy.  Does not feel like PPI or H2 blockers really help as she does not really have any reflux type symptoms.  Uses zofran prn, which helps a small amount.  ? Due to antibiotic side effects as well or just a component of her IBS.  Also smokes marijuana and uses a lot of CBD so ? If that is contributing (although she claims not).   3.  Family history of colon cancer --valence colonoscopy recommended at the 5-year mark which would be December 2023    *Plan to repeat course of Xifaxan for 2 weeks once she has completed her omnicef.  Once done with Xifaxan she will start Florastor daily.   *Continue Zofran prn. *Follow-up with Dr. Rhea Belton in 6-8 weeks.  CC:  Daryel Gerald, FNP   Addendum: Reviewed and agree  with management. Pyrtle, Carie Caddy, MD

## 2018-04-28 NOTE — Telephone Encounter (Signed)
Received fax from [facility] prior auth has been approved.

## 2018-05-01 ENCOUNTER — Telehealth: Payer: Self-pay | Admitting: Gastroenterology

## 2018-05-01 ENCOUNTER — Other Ambulatory Visit (INDEPENDENT_AMBULATORY_CARE_PROVIDER_SITE_OTHER): Payer: BLUE CROSS/BLUE SHIELD

## 2018-05-01 DIAGNOSIS — K625 Hemorrhage of anus and rectum: Secondary | ICD-10-CM

## 2018-05-01 DIAGNOSIS — K580 Irritable bowel syndrome with diarrhea: Secondary | ICD-10-CM | POA: Diagnosis not present

## 2018-05-01 DIAGNOSIS — R197 Diarrhea, unspecified: Secondary | ICD-10-CM

## 2018-05-01 LAB — CBC WITH DIFFERENTIAL/PLATELET
BASOS PCT: 0.7 % (ref 0.0–3.0)
Basophils Absolute: 0 10*3/uL (ref 0.0–0.1)
EOS ABS: 0.1 10*3/uL (ref 0.0–0.7)
EOS PCT: 1.7 % (ref 0.0–5.0)
HEMATOCRIT: 36.1 % (ref 36.0–46.0)
Hemoglobin: 12.1 g/dL (ref 12.0–15.0)
Lymphocytes Relative: 24 % (ref 12.0–46.0)
Lymphs Abs: 1.4 10*3/uL (ref 0.7–4.0)
MCHC: 33.6 g/dL (ref 30.0–36.0)
MCV: 83.9 fl (ref 78.0–100.0)
MONO ABS: 0.4 10*3/uL (ref 0.1–1.0)
Monocytes Relative: 7.1 % (ref 3.0–12.0)
NEUTROS ABS: 4 10*3/uL (ref 1.4–7.7)
Neutrophils Relative %: 66.5 % (ref 43.0–77.0)
PLATELETS: 260 10*3/uL (ref 150.0–400.0)
RBC: 4.3 Mil/uL (ref 3.87–5.11)
RDW: 13.8 % (ref 11.5–15.5)
WBC: 6 10*3/uL (ref 4.0–10.5)

## 2018-05-01 LAB — COMPREHENSIVE METABOLIC PANEL
ALBUMIN: 4.1 g/dL (ref 3.5–5.2)
ALT: 8 U/L (ref 0–35)
AST: 11 U/L (ref 0–37)
Alkaline Phosphatase: 56 U/L (ref 39–117)
BUN: 10 mg/dL (ref 6–23)
CALCIUM: 8.8 mg/dL (ref 8.4–10.5)
CHLORIDE: 104 meq/L (ref 96–112)
CO2: 29 meq/L (ref 19–32)
CREATININE: 0.8 mg/dL (ref 0.40–1.20)
GFR: 86.89 mL/min (ref >60.00)
Glucose, Bld: 91 mg/dL (ref 70–99)
POTASSIUM: 4.2 meq/L (ref 3.5–5.1)
SODIUM: 137 meq/L (ref 135–145)
Total Bilirubin: 0.4 mg/dL (ref 0.2–1.2)
Total Protein: 6.6 g/dL (ref 6.0–8.3)

## 2018-05-01 NOTE — Telephone Encounter (Signed)
Would recommend she have CBC, CMP C diff, sent STAT GI pathogen panel If further bleeding she will need to be seen in the ER Hold on rifaximin at this point

## 2018-05-01 NOTE — Telephone Encounter (Signed)
BRBPR starting today, diarrhea episodes every 30 min. Since this morning.  No abd pain but a discomfort or feeling of urgency with cramping.  Discomfort subsides for about 30 min. Then the cycle repeats.  Temp 100.5 at 1:30 pm.  Took 1  500 mg tylenol an hour ago.  Last saw Shanda BumpsJessica on 04/27/18.  She is still on omnicef, has not started xifaxan or florastor, Shanda BumpsJessica told her to wait until she finished omnicef.  Dr Rhea BeltonPyrtle please advise

## 2018-05-01 NOTE — Telephone Encounter (Signed)
The pt has been advised of the information and verbalized understanding.   She will come in ASAP for testing. She was advised to go to the ED if rectal bleeding continues. She will not start xifaxan.

## 2018-05-02 ENCOUNTER — Other Ambulatory Visit: Payer: BLUE CROSS/BLUE SHIELD

## 2018-05-02 DIAGNOSIS — K625 Hemorrhage of anus and rectum: Secondary | ICD-10-CM

## 2018-05-02 DIAGNOSIS — R197 Diarrhea, unspecified: Secondary | ICD-10-CM

## 2018-05-02 DIAGNOSIS — K580 Irritable bowel syndrome with diarrhea: Secondary | ICD-10-CM

## 2018-05-03 ENCOUNTER — Other Ambulatory Visit: Payer: Self-pay

## 2018-05-03 MED ORDER — VANCOMYCIN HCL 125 MG PO CAPS: 125.0000 mg | ORAL_CAPSULE | Freq: Four times a day (QID) | ORAL | 0 refills | Status: DC

## 2018-05-05 ENCOUNTER — Telehealth: Payer: Self-pay

## 2018-05-05 LAB — CLOSTRIDIUM DIFFICILE TOXIN B, QUALITATIVE, REAL-TIME PCR: Toxigenic C. Difficile by PCR: DETECTED — AB

## 2018-05-05 LAB — GASTROINTESTINAL PATHOGEN PANEL PCR
C. DIFFICILE TOX A/B, PCR: NOT DETECTED
CRYPTOSPORIDIUM, PCR: NOT DETECTED
Campylobacter, PCR: NOT DETECTED
E COLI (ETEC) LT/ST, PCR: NOT DETECTED
E COLI (STEC) STX1/STX2, PCR: NOT DETECTED
E coli 0157, PCR: NOT DETECTED
Giardia lamblia, PCR: NOT DETECTED
Norovirus, PCR: NOT DETECTED
Rotavirus A, PCR: NOT DETECTED
Salmonella, PCR: NOT DETECTED
Shigella, PCR: NOT DETECTED

## 2018-05-05 NOTE — Telephone Encounter (Signed)
[facility] dated [facility]/3.  It appears this has already been addressed.

## 2018-05-05 NOTE — Telephone Encounter (Signed)
Noted pt has been treated.

## 2018-05-05 NOTE — Telephone Encounter (Signed)
We received via fax that the pt c diff tox a/b was positive.  Other GI path labs were negative.  Dr Myrtie Neitheranis this pt is Dr Lauro FranklinPyrtle's you are doc of the day.  Please advise?

## 2018-05-15 ENCOUNTER — Other Ambulatory Visit: Payer: Self-pay | Admitting: Psychiatry

## 2018-05-17 ENCOUNTER — Telehealth: Payer: Self-pay | Admitting: Internal Medicine

## 2018-05-17 DIAGNOSIS — R197 Diarrhea, unspecified: Secondary | ICD-10-CM

## 2018-05-17 NOTE — Telephone Encounter (Signed)
If totally off vancomycin please repeat C diff PCR test Continue florastor 250 mg BID

## 2018-05-17 NOTE — Telephone Encounter (Signed)
Spoke with pt and she is aware, order in epic. 

## 2018-05-17 NOTE — Telephone Encounter (Signed)
Pt states she finished the vancomycin 2-3 days ago. Called to report she is still having loose stools, 3-4/day and at times with urgency. States it may vary based on how much she eats that day. Pt wonders if the c diff has cleared. Please advise.

## 2018-05-18 ENCOUNTER — Telehealth: Payer: Self-pay | Admitting: Gastroenterology

## 2018-05-18 NOTE — Telephone Encounter (Signed)
[facility] pharmacy calling to get verification on medication xifaxan. Requesting call back to confirm at #[phone].

## 2018-05-18 NOTE — Telephone Encounter (Signed)
Looks like her C diff came back positive and she was told to hold Xifaxan. Spoke to pharmacy and informed them she does not need it at this time.

## 2018-05-19 ENCOUNTER — Telehealth: Payer: Self-pay | Admitting: Internal Medicine

## 2018-05-19 ENCOUNTER — Other Ambulatory Visit: Payer: BLUE CROSS/BLUE SHIELD

## 2018-05-19 NOTE — Telephone Encounter (Signed)
Pt has not given stool specimen yet. She states she just found out that she was only given 10 days worth of vanco. Per Dr. Rhea BeltonPyrtle she should still give the stool specimen and we can go from there. Pt aware.

## 2018-05-19 NOTE — Telephone Encounter (Signed)
Pt called to inform that she did not take full treatment of vancomycin by mistake. Her pharmacy did not give her all the pills prescribed so she missed 4 pills. She wants to know if she needs to do course of antibiotics again. Pls call her

## 2018-05-22 ENCOUNTER — Other Ambulatory Visit: Payer: BLUE CROSS/BLUE SHIELD

## 2018-05-22 DIAGNOSIS — R197 Diarrhea, unspecified: Secondary | ICD-10-CM

## 2018-05-23 LAB — CLOSTRIDIUM DIFFICILE BY PCR: CDIFFPCR: NEGATIVE

## 2018-06-05 ENCOUNTER — Encounter: Payer: Self-pay | Admitting: Psychiatry

## 2018-06-05 ENCOUNTER — Other Ambulatory Visit: Payer: Self-pay

## 2018-06-05 ENCOUNTER — Ambulatory Visit (INDEPENDENT_AMBULATORY_CARE_PROVIDER_SITE_OTHER): Payer: BLUE CROSS/BLUE SHIELD | Admitting: Psychiatry

## 2018-06-05 VITALS — BP 141/100 | HR 80 | Temp 98.3°F | Wt 143.4 lb

## 2018-06-05 DIAGNOSIS — F331 Major depressive disorder, recurrent, moderate: Secondary | ICD-10-CM

## 2018-06-05 DIAGNOSIS — F4312 Post-traumatic stress disorder, chronic: Secondary | ICD-10-CM

## 2018-06-05 MED ORDER — ARIPIPRAZOLE 5 MG PO TABS: 5.0000 mg | ORAL_TABLET | Freq: Every day | ORAL | 3 refills | Status: DC

## 2018-06-05 MED ORDER — DULOXETINE HCL 30 MG PO CPEP: 30.0000 mg | ORAL_CAPSULE | Freq: Every morning | ORAL | 1 refills | Status: DC
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
## 2018-06-12 ENCOUNTER — Encounter: Payer: Self-pay | Admitting: *Deleted

## 2018-07-05 ENCOUNTER — Ambulatory Visit: Payer: BLUE CROSS/BLUE SHIELD | Admitting: Internal Medicine

## 2018-07-19 ENCOUNTER — Other Ambulatory Visit: Payer: Self-pay | Admitting: Internal Medicine

## 2018-07-20 ENCOUNTER — Encounter: Payer: Self-pay | Admitting: Internal Medicine

## 2018-07-20 ENCOUNTER — Ambulatory Visit: Payer: BLUE CROSS/BLUE SHIELD | Admitting: Internal Medicine

## 2018-07-20 ENCOUNTER — Other Ambulatory Visit: Payer: BLUE CROSS/BLUE SHIELD

## 2018-07-20 VITALS — BP 106/82 | HR 80 | Ht 60.75 in | Wt 146.5 lb

## 2018-07-20 DIAGNOSIS — K580 Irritable bowel syndrome with diarrhea: Secondary | ICD-10-CM

## 2018-07-20 DIAGNOSIS — R1084 Generalized abdominal pain: Secondary | ICD-10-CM

## 2018-07-20 DIAGNOSIS — Z8619 Personal history of other infectious and parasitic diseases: Secondary | ICD-10-CM | POA: Diagnosis not present

## 2018-07-20 DIAGNOSIS — R110 Nausea: Secondary | ICD-10-CM | POA: Diagnosis not present

## 2018-07-20 MED ORDER — ELUXADOLINE 75 MG PO TABS: 1.0000 | ORAL_TABLET | Freq: Two times a day (BID) | ORAL | 1 refills | Status: DC

## 2018-07-20 NOTE — Progress Notes (Signed)
Subjective:    Patient ID: Carolyn Parker, female    DOB: 01/23/1981, 37 y.o.   MRN: [id number]  HPI Carolyn Parker is a 37 year old female with a past medical history of IBS with diarrhea, C. difficile, kidney stones and chronic UTI, chronic nausea, possible alpha gal syndrome, family history of colon cancer, anxiety, depression and PTSD who is here for follow-up.  She was last seen on 04/27/2018 by Doug SouJessica Zehr, PA-C.  Stool studies were performed in early July and positive for C. difficile.  She was treated with vancomycin x10 days and then a follow-up C. difficile test was negative.  She reports she continues to struggle with diarrhea on a daily basis.  She also has been on multiple antibiotics for chronic UTI and is now taking chronic daily suppressive Macrodantin.  She has 2-8 bowel movements per day which can be urgent and are often loose.  She is using Zofran at least twice per day to help with chronic nausea.  This does help but not completely relieve the nausea.  She takes Lomotil at least 2 times per day occasionally more.  Without Lomotil she definitively has diarrhea.  She never really improved from a diarrhea perspective with vancomycin therapy despite the fact that her C. difficile tested negative thereafter.  She has not had fevers or chills.  No blood in her stool or melena.  She took rifaximin previously which worked extremely well for her diarrhea and nearly normalized her bowel movements for a period of a few months.  She does have diffuse abdominal pain and cramping which is better after bowel movement but also never really gone/absent.  She is using marijuana and CBD inhaled for nausea.  She reports marijuana works the best for her nausea than any other medication.  She occasionally uses edibles but also vapor variety when available to her.   Review of Systems As per HPI, otherwise negative  Current Medications, Allergies, Past Medical History, Past Surgical History,  Family History and Social History were reviewed in [facility] electronic medical record.     Objective:   Physical Exam BP 106/82   Pulse 80   Ht 5' 0.75" (1.543 m)   Wt 146 lb 8 oz (66.5 kg)   BMI 27.91 kg/m  Constitutional: Well-developed and well-nourished. No distress. HEENT: Normocephalic and atraumatic.   No scleral icterus. Neck: Neck supple. Trachea midline. Cardiovascular: Normal rate, regular rhythm and intact distal pulses. No M/R/G Pulmonary/chest: Effort normal and breath sounds normal. No wheezing, rales or rhonchi. Abdominal: Soft, if usually tender without rebound or guarding, nondistended. Bowel sounds active throughout. There are no masses palpable. No hepatosplenomegaly. Extremities: no clubbing, cyanosis, or edema Neurological: Alert and oriented to person place and time. Skin: Skin is warm and dry. Psychiatric: Normal mood and affect. Behavior is normal.  CBC    Component Value Date/Time   WBC 6.0 05/01/2018 1555   RBC 4.30 05/01/2018 1555   HGB 12.1 05/01/2018 1555   HCT 36.1 05/01/2018 1555   PLT 260.0 05/01/2018 1555   MCV 83.9 05/01/2018 1555   MCH 28.8 12/28/2017 0444   MCHC 33.6 05/01/2018 1555   RDW 13.8 05/01/2018 1555   LYMPHSABS 1.4 05/01/2018 1555   MONOABS 0.4 05/01/2018 1555   EOSABS 0.1 05/01/2018 1555   BASOSABS 0.0 05/01/2018 1555   CMP     Component Value Date/Time   NA 137 05/01/2018 1555   K 4.2 05/01/2018 1555   CL 104 05/01/2018 1555   CO2  29 05/01/2018 1555   GLUCOSE 91 05/01/2018 1555   BUN 10 05/01/2018 1555   CREATININE 0.80 05/01/2018 1555   CALCIUM 8.8 05/01/2018 1555   PROT 6.6 05/01/2018 1555   ALBUMIN 4.1 05/01/2018 1555   AST 11 05/01/2018 1555   ALT 8 05/01/2018 1555   ALKPHOS 56 05/01/2018 1555   BILITOT 0.4 05/01/2018 1555   GFRNONAA >60 12/28/2017 0444   GFRAA >60 12/28/2017 0444   C. difficile +05/02/2018; C. difficile -05/22/18 after 10 days of vancomycin therapy      Assessment & Plan:    37 year old female with a past medical history of IBS with diarrhea, C. difficile, kidney stones and chronic UTI, chronic nausea, possible alpha gal syndrome, family history of colon cancer, anxiety, depression and PTSD who is here for follow-up.    1. IBS-D/chronic nausea/abd pain/hx of C diff --I am not convinced that she had C. difficile colitis and her symptoms certainly did not respond to vancomycin therapy.  That said her follow-up C. difficile PCR was negative.  She is on Macrodantin and so would be at risk for antibiotic associated infection/colitis though overall Macrodantin's association with C. difficile is fairly low. --I am going to repeat GI pathogen panel to ensure no sign of infection --If negative I recommended we try Viberzi 75 mg twice daily.  We discussed the risk of this medication in association with pancreatitis though this is very rare in patients with gallbladders.  Her gallbladder is in place.  Hopefully this medication will provide benefit for her abdominal pain, nausea and diarrhea. --She can continue Zofran 4 mg every 6-8 hours as needed for nausea --She can continue Lomotil 1 tablet 4 times daily as needed though if fibers is effective she may not need this medication any longer  I have asked that she not start the Viberzi until we know that her GI pathogen panel is indeed negative Follow-up in 2 to 3 months, sooner if necessary  25 minutes spent with the patient today. Greater than 50% was spent in counseling and coordination of care with the patient

## 2018-07-20 NOTE — Patient Instructions (Signed)
We have sent the following medications to your pharmacy for you to pick up at your convenience: Viberzi  Your provider has requested that you go to the basement level for lab work before leaving today. Press "B" on the elevator. The lab is located at the first door on the left as you exit the elevator.  Please return your stool studies PRIOR to starting Viberzi.  If you are age 35 or older, your body mass index should be between 23-30. Your Body mass index is 27.91 kg/m. If this is out of the aforementioned range listed, please consider follow up with your Primary Care Provider.  If you are age 35 or younger, your body mass index should be between 19-25. Your Body mass index is 27.91 kg/m. If this is out of the aformentioned range listed, please consider follow up with your Primary Care Provider.

## 2018-08-03 ENCOUNTER — Other Ambulatory Visit (HOSPITAL_COMMUNITY)
Admission: RE | Admit: 2018-08-03 | Discharge: 2018-08-03 | Disposition: A | Discharge status: Home / Self Care | Payer: BLUE CROSS/BLUE SHIELD | Source: Ambulatory Visit | Attending: Obstetrics & Gynecology | Admitting: Obstetrics & Gynecology

## 2018-08-03 ENCOUNTER — Ambulatory Visit (INDEPENDENT_AMBULATORY_CARE_PROVIDER_SITE_OTHER): Payer: BLUE CROSS/BLUE SHIELD | Admitting: Obstetrics & Gynecology

## 2018-08-03 ENCOUNTER — Encounter: Payer: Self-pay | Admitting: Obstetrics & Gynecology

## 2018-08-03 VITALS — BP 100/60 | Ht 60.0 in | Wt 148.0 lb

## 2018-08-03 DIAGNOSIS — Z23 Encounter for immunization: Secondary | ICD-10-CM | POA: Diagnosis not present

## 2018-08-03 DIAGNOSIS — Z113 Encounter for screening for infections with a predominantly sexual mode of transmission: Secondary | ICD-10-CM | POA: Diagnosis present

## 2018-08-03 DIAGNOSIS — Z124 Encounter for screening for malignant neoplasm of cervix: Secondary | ICD-10-CM | POA: Insufficient documentation

## 2018-08-03 DIAGNOSIS — Z0000 Encounter for general adult medical examination without abnormal findings: Secondary | ICD-10-CM | POA: Diagnosis not present

## 2018-08-03 NOTE — Progress Notes (Signed)
HPI:      [name] is a 37 y.o. No obstetric history on file. who LMP was Patient's last menstrual period was 07/26/2018., she presents today for her annual examination. The patient has no complaints today. The patient is sexually active. Her last pap: approximate date 2018 and was abnormal: ASCUS, HPV Neg. The patient does perform self breast exams.  There is no notable family history of breast or ovarian cancer in her family.  The patient has regular exercise: yes.  The patient denies current symptoms of depression.    GYN History: Contraception: tubal ligation and she no longer takes OCPs for period and endometriosis  PMHx: Past Medical History:  Diagnosis Date  . Anxiety   . Chronic post-traumatic stress disorder (PTSD)    followed by dr Garnetta Buddy  . Complication of anesthesia    hard to wake  . Constipation   . Family history of breast cancer   . Genetic testing 12/2013   My Risk neg   . History of kidney stones   . Hydronephrosis, left   . Irritable bowel syndrome with diarrhea   . Kidney stone   . Left ureteral stone   . Major depressive disorder    followed by dr Garnetta Buddy [facility])  . Mild asthma    Past Surgical History:  Procedure Laterality Date  . COLONOSCOPY  10-06-2017  dr pyrtle  . CYSTOSCOPY WITH RETROGRADE PYELOGRAM, URETEROSCOPY AND STENT PLACEMENT Left 12/27/2017   Procedure: CYSTOSCOPY WITH RETROGRADE PYELOGRAM, URETEROSCOPY AND STENT PLACEMENT;  Surgeon: Carolyn Ache, MD;  Location: [facility];  Service: Urology;  Laterality: Left;  . CYSTOSCOPY/URETEROSCOPY/HOLMIUM LASER/STENT PLACEMENT Left 01/17/2018   Procedure: CYSTOSCOPY/URETEROSCOPY/STENT EXCHANGE;  Surgeon: Carolyn Field, MD;  Location: [facility];  Service: Urology;  Laterality: Left;  . ESSURE TUBAL LIGATION Bilateral 2012  . EXTRACORPOREAL SHOCK WAVE LITHOTRIPSY Left 12/26/2017   Procedure: LEFT EXTRACORPOREAL SHOCK WAVE LITHOTRIPSY (ESWL);  Surgeon: Hildred Laser, MD;   Location: [facility];  Service: Urology;  Laterality: Left;  . LAPAROSCOPIC ENDOMETRIOSIS FULGURATION  2006   Family History  Problem Relation Age of Onset  . Anxiety disorder Mother   . Depression Mother   . Diabetes Mother   . Heart Problems Mother   . Colon cancer Mother 70  . Hypertension Father   . Bipolar disorder Sister   . Colon cancer Maternal Grandmother 80  . Breast cancer Maternal Aunt 30   Social History   Tobacco Use  . Smoking status: Former Smoker    Years: 10.00    Types: Cigarettes    Start date: 05/20/2000    Last attempt to quit: 05/20/2010    Years since quitting: 8.2  . Smokeless tobacco: Never Used  Substance Use Topics  . Alcohol use: Yes    Alcohol/week: 0.0 standard drinks    Comment: socially  . Drug use: Yes    Frequency: 7.0 times per week    Types: Marijuana    Comment: uses daily    Current Outpatient Medications:  .  amoxicillin (AMOXIL) 500 MG capsule, , Disp: , Rfl: 0 .  ARIPiprazole (ABILIFY) 5 MG tablet, Take 1 tablet (5 mg total) by mouth at bedtime., Disp: 30 tablet, Rfl: 3 .  diphenoxylate-atropine (LOMOTIL) 2.5-0.025 MG tablet, Take by mouth as needed for diarrhea or loose stools., Disp: , Rfl:  .  DULoxetine (CYMBALTA) 30 MG capsule, Take 1 capsule (30 mg total) by mouth every morning., Disp: 90 capsule, Rfl: 1 .  Eluxadoline (VIBERZI) 75 MG TABS, Take 1 tablet by mouth 2 (two) times daily., Disp: 60 tablet, Rfl: 1 .  montelukast (SINGULAIR) 10 MG tablet, Take by mouth., Disp: , Rfl:  .  nitrofurantoin (MACRODANTIN) 50 MG capsule, 50 mg daily. , Disp: , Rfl: 2 .  ondansetron (ZOFRAN) 4 MG tablet, TAKE 1 TABLET EVERY 8 HOURS AS NEEDED FOR NAUSEA & VOMITING, Disp: 30 tablet, Rfl: 0 Allergies: Bee venom; Amoxicillin-pot clavulanate; Beef-derived products; Dairy aid [lactase]; Pork-derived products; Sertraline; Shellfish allergy; and Wheat bran  Review of Systems  Constitutional: Negative for chills, fever and malaise/fatigue.  HENT:  Negative for congestion, sinus pain and sore throat.   Eyes: Negative for blurred vision and pain.  Respiratory: Negative for cough and wheezing.   Cardiovascular: Negative for chest pain and leg swelling.  Gastrointestinal: Negative for abdominal pain, constipation, diarrhea, heartburn, nausea and vomiting.  Genitourinary: Negative for dysuria, frequency, hematuria and urgency.  Musculoskeletal: Negative for back pain, joint pain, myalgias and neck pain.  Skin: Negative for itching and rash.  Neurological: Negative for dizziness, tremors and weakness.  Endo/Heme/Allergies: Does not bruise/bleed easily.  Psychiatric/Behavioral: Negative for depression. The patient is not nervous/anxious and does not have insomnia.     Objective: BP 100/60   Ht 5' (1.524 m)   Wt 148 lb (67.1 kg)   LMP 07/26/2018   BMI 28.90 kg/m   Filed Weights   08/03/18 0849  Weight: 148 lb (67.1 kg)   Body mass index is 28.9 kg/m. Physical Exam  Constitutional: She is oriented to person, place, and time. She appears well-developed and well-nourished. No distress.  Genitourinary: Rectum normal, vagina normal and uterus normal. Pelvic exam was performed with patient supine. There is no rash or lesion on the right labia. There is no rash or lesion on the left labia. Vagina exhibits no lesion. No bleeding in the vagina. Right adnexum does not display mass and does not display tenderness. Left adnexum does not display mass and does not display tenderness. Cervix does not exhibit motion tenderness, lesion, friability or polyp.   Uterus is mobile and midaxial. Uterus is not enlarged or exhibiting a mass.  HENT:  Head: Normocephalic and atraumatic. Head is without laceration.  Right Ear: Hearing normal.  Left Ear: Hearing normal.  Nose: No epistaxis.  No foreign bodies.  Mouth/Throat: Uvula is midline, oropharynx is clear and moist and mucous membranes are normal.  Eyes: Pupils are equal, round, and reactive to light.    Neck: Normal range of motion. Neck supple. No thyromegaly present.  Cardiovascular: Normal rate and regular rhythm. Exam reveals no gallop and no friction rub.  No murmur heard. Pulmonary/Chest: Effort normal and breath sounds normal. No respiratory distress. She has no wheezes. Right breast exhibits no mass, no skin change and no tenderness. Left breast exhibits no mass, no skin change and no tenderness.  Abdominal: Soft. Bowel sounds are normal. She exhibits no distension. There is no tenderness. There is no rebound.  Musculoskeletal: Normal range of motion.  Neurological: She is alert and oriented to person, place, and time. No cranial nerve deficit.  Skin: Skin is warm and dry.  Psychiatric: She has a normal mood and affect. Judgment normal.  Vitals reviewed.   Assessment:  ANNUAL EXAM 1. Annual physical exam   2. Screening for cervical cancer   3. Screen for STD (sexually transmitted disease)    Screening Plan:            1.  Cervical Screening-  Pap smear done today Discussed ASCUS and HPV neg from last year  2. Labs managed by PCP  3. Counseling for contraception: bilateral tubal ligation  No longer on OCPs for endometriosis   4. STD exposure risk and desires screening labs  5, Flu shot today     F/U  Return in about 1 year (around 08/04/2019) for Annual.  [name], MD, [facility] 08/03/2018  9:07 AM

## 2018-08-03 NOTE — Patient Instructions (Signed)
PAP every year Labs yearly (with PCP) 

## 2018-08-04 LAB — HEP, RPR, HIV PANEL
HIV Screen 4th Generation wRfx: NONREACTIVE
Hepatitis B Surface Ag: NEGATIVE
RPR Ser Ql: NONREACTIVE

## 2018-08-07 ENCOUNTER — Encounter: Payer: Self-pay | Admitting: Psychiatry

## 2018-08-07 ENCOUNTER — Other Ambulatory Visit: Payer: Self-pay

## 2018-08-07 ENCOUNTER — Ambulatory Visit (INDEPENDENT_AMBULATORY_CARE_PROVIDER_SITE_OTHER): Payer: BLUE CROSS/BLUE SHIELD | Admitting: Psychiatry

## 2018-08-07 VITALS — BP 105/72 | HR 77 | Temp 98.2°F | Wt 152.4 lb

## 2018-08-07 DIAGNOSIS — F4312 Post-traumatic stress disorder, chronic: Secondary | ICD-10-CM

## 2018-08-07 MED ORDER — DULOXETINE HCL 30 MG PO CPEP: 30.0000 mg | ORAL_CAPSULE | Freq: Two times a day (BID) | ORAL | 1 refills | Status: DC

## 2018-08-07 MED ORDER — ARIPIPRAZOLE 5 MG PO TABS: 5.0000 mg | ORAL_TABLET | Freq: Every day | ORAL | 1 refills | Status: DC
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
## 2018-08-08 LAB — CYTOLOGY - PAP
Chlamydia: NEGATIVE
DIAGNOSIS: NEGATIVE
HPV (WINDOPATH): NOT DETECTED
Neisseria Gonorrhea: NEGATIVE

## 2018-09-04 ENCOUNTER — Other Ambulatory Visit: Payer: Self-pay | Admitting: Internal Medicine

## 2018-09-18 ENCOUNTER — Other Ambulatory Visit: Payer: Self-pay | Admitting: Psychiatry

## 2018-10-09 ENCOUNTER — Ambulatory Visit: Payer: BLUE CROSS/BLUE SHIELD | Admitting: Psychiatry

## 2018-10-09 ENCOUNTER — Other Ambulatory Visit: Payer: Self-pay

## 2018-10-09 ENCOUNTER — Encounter: Payer: Self-pay | Admitting: Psychiatry

## 2018-10-09 VITALS — BP 111/74 | HR 83 | Temp 99.3°F | Wt 157.0 lb

## 2018-10-09 DIAGNOSIS — F331 Major depressive disorder, recurrent, moderate: Secondary | ICD-10-CM | POA: Diagnosis not present

## 2018-10-09 DIAGNOSIS — F4312 Post-traumatic stress disorder, chronic: Secondary | ICD-10-CM | POA: Diagnosis not present

## 2018-10-09 MED ORDER — DULOXETINE HCL 30 MG PO CPEP: 30.0000 mg | ORAL_CAPSULE | Freq: Two times a day (BID) | ORAL | 1 refills | Status: DC

## 2018-10-09 MED ORDER — ARIPIPRAZOLE 5 MG PO TABS: 5.0000 mg | ORAL_TABLET | Freq: Every day | ORAL | 1 refills | Status: DC
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
## 2018-10-13 ENCOUNTER — Other Ambulatory Visit: Payer: Self-pay

## 2018-10-13 MED ORDER — ELUXADOLINE 75 MG PO TABS: 1.0000 | ORAL_TABLET | Freq: Two times a day (BID) | ORAL | 1 refills | Status: DC

## 2018-11-11 ENCOUNTER — Other Ambulatory Visit: Payer: Self-pay | Admitting: Psychiatry

## 2019-01-08 ENCOUNTER — Other Ambulatory Visit: Payer: Self-pay

## 2019-01-08 ENCOUNTER — Ambulatory Visit: Payer: BLUE CROSS/BLUE SHIELD | Admitting: Psychiatry

## 2019-01-08 ENCOUNTER — Encounter: Payer: Self-pay | Admitting: Psychiatry

## 2019-01-08 VITALS — BP 119/81 | HR 73 | Temp 98.2°F | Wt 162.4 lb

## 2019-01-08 DIAGNOSIS — F4312 Post-traumatic stress disorder, chronic: Secondary | ICD-10-CM

## 2019-01-08 MED ORDER — ARIPIPRAZOLE 5 MG PO TABS: 5.0000 mg | ORAL_TABLET | Freq: Every day | ORAL | 3 refills | Status: DC

## 2019-01-08 MED ORDER — DULOXETINE HCL 30 MG PO CPEP: 30.0000 mg | ORAL_CAPSULE | Freq: Two times a day (BID) | ORAL | 3 refills | Status: DC
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
## 2019-02-21 ENCOUNTER — Encounter: Payer: Self-pay | Admitting: Internal Medicine

## 2019-04-23 ENCOUNTER — Other Ambulatory Visit: Payer: Self-pay | Admitting: Psychiatry

## 2019-05-07 ENCOUNTER — Other Ambulatory Visit: Payer: Self-pay

## 2019-05-07 ENCOUNTER — Ambulatory Visit (INDEPENDENT_AMBULATORY_CARE_PROVIDER_SITE_OTHER): Payer: BLUE CROSS/BLUE SHIELD | Admitting: Psychiatry

## 2019-05-07 ENCOUNTER — Encounter: Payer: Self-pay | Admitting: Psychiatry

## 2019-05-07 DIAGNOSIS — F4312 Post-traumatic stress disorder, chronic: Secondary | ICD-10-CM

## 2019-05-07 DIAGNOSIS — F331 Major depressive disorder, recurrent, moderate: Secondary | ICD-10-CM | POA: Diagnosis not present

## 2019-05-07 MED ORDER — DULOXETINE HCL 30 MG PO CPEP: 30.0000 mg | ORAL_CAPSULE | Freq: Two times a day (BID) | ORAL | 3 refills | Status: DC

## 2019-05-07 MED ORDER — ARIPIPRAZOLE 5 MG PO TABS: 5.0000 mg | ORAL_TABLET | Freq: Every day | ORAL | 3 refills | Status: DC

## 2019-05-07 NOTE — Progress Notes (Signed)
Patient ID: Carolyn Parker, female   DOB: [birth year], 38 y.o.   MRN: [id number]   Patient is a 38 year old female with history of depression who was followed up for her medications. She reported that she has been spending time at home and they have a pool. She reported that she has not traveled outside of her house and has been feeling safe. She stated that her boys are staying at home as well. She has been compliant with her medications. She reported that her headaches are improving. No other acute symptoms noted. She denied having any suicidal or homicidal ideations or plans.  Plan I will refill her medications. Follow-up in two months earlier depending on her symptoms  I discussed the assessment and treatment plan with the patient. The patient was provided an opportunity to ask questions and all were answered. The patient agreed with the plan and demonstrated an understanding of the instructions.  The patient was advised to call back or seek an in-person evaluation if the symptoms worsen or if the condition fails to improve as anticipated.  I provided58minutes of non-face-to-face time during this encounter.

## 2019-06-29 IMAGING — CR DG ABDOMEN 1V
2 series · 2 of 2 positions shown · non-contrast
Comparison: KUB October 20, 2018

CLINICAL DATA: Pre lithotripsy study. History of left-sided kidney
stone

EXAM:
ABDOMEN - 1 VIEW

[t abdomen supine (1 of 2)]
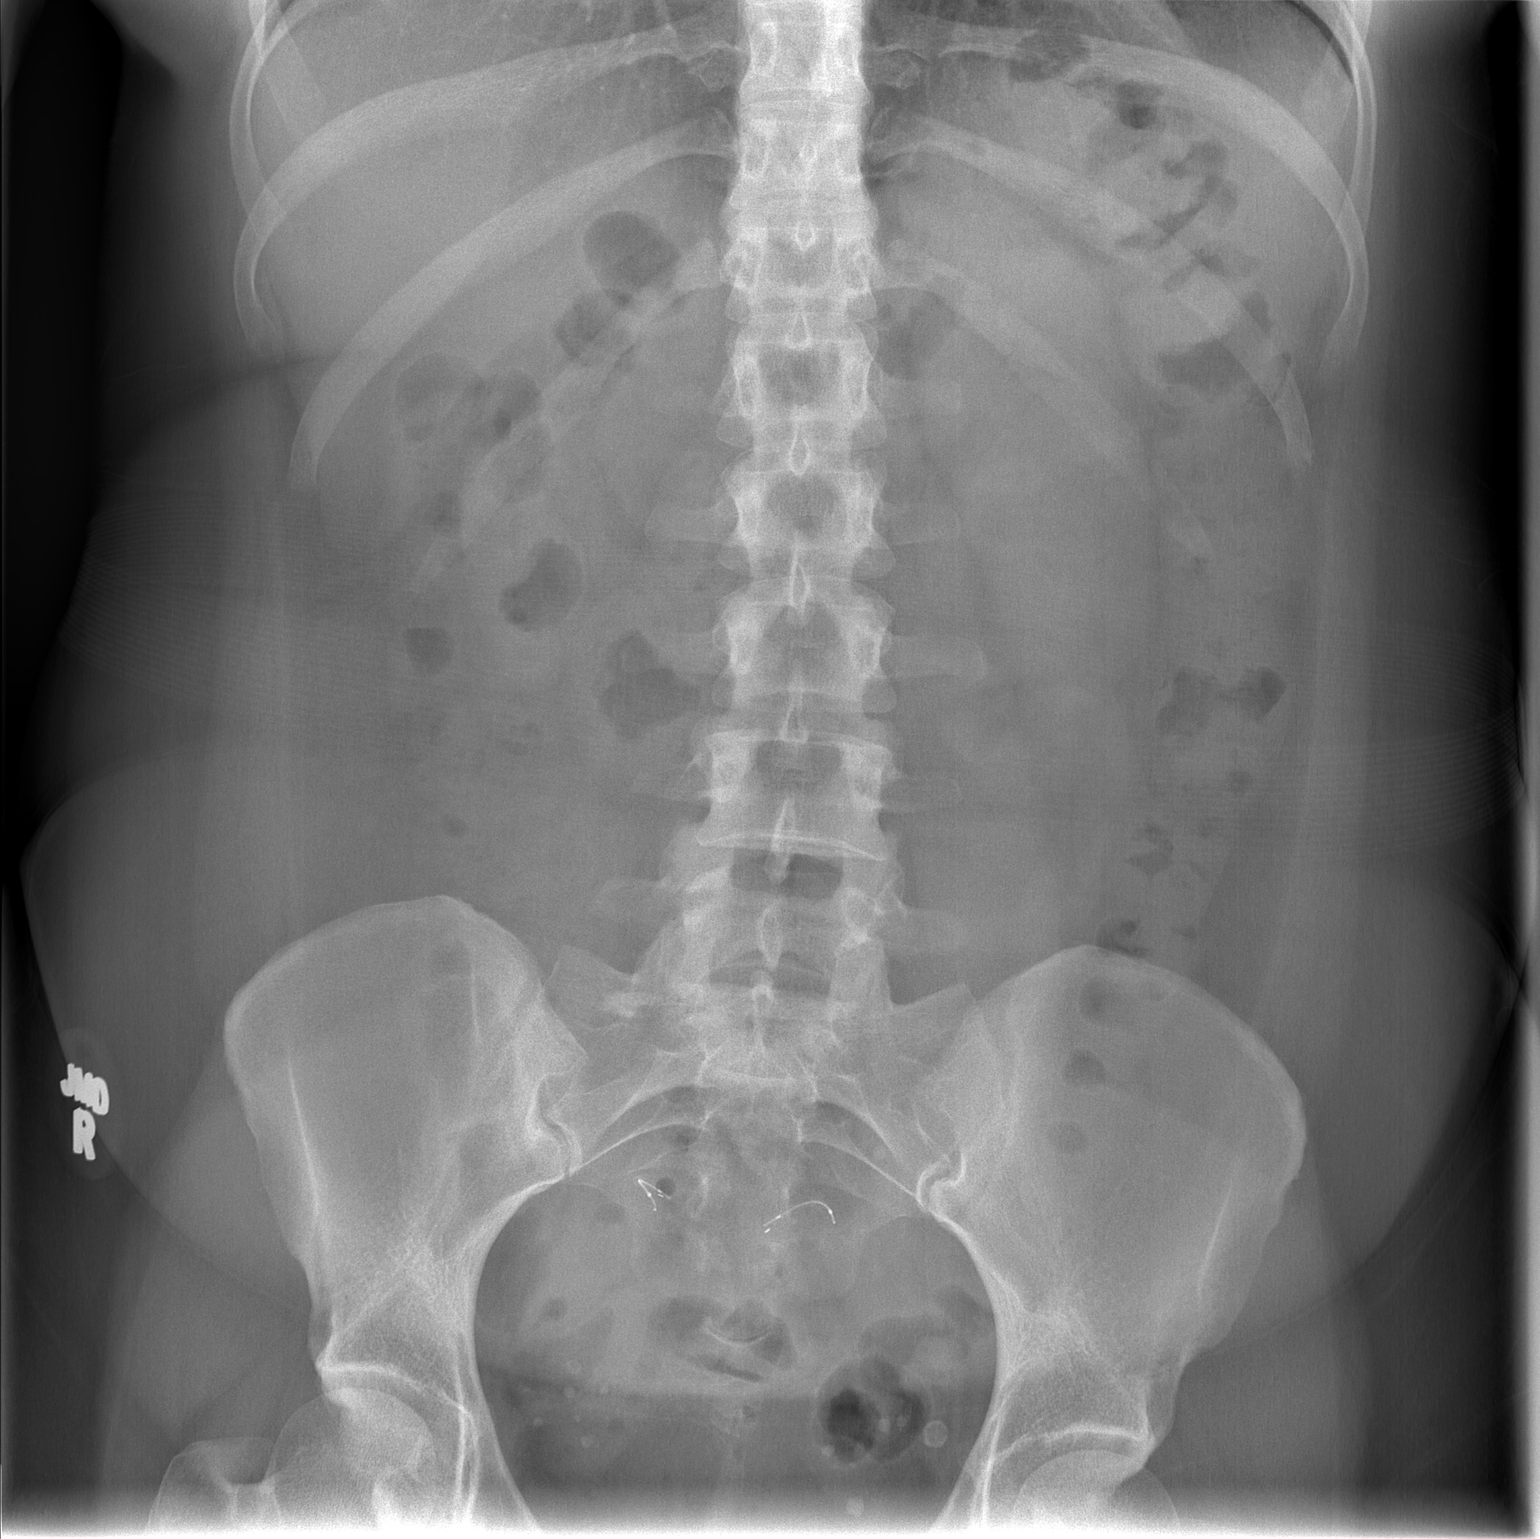

[t abdomen supine (2 of 2)]
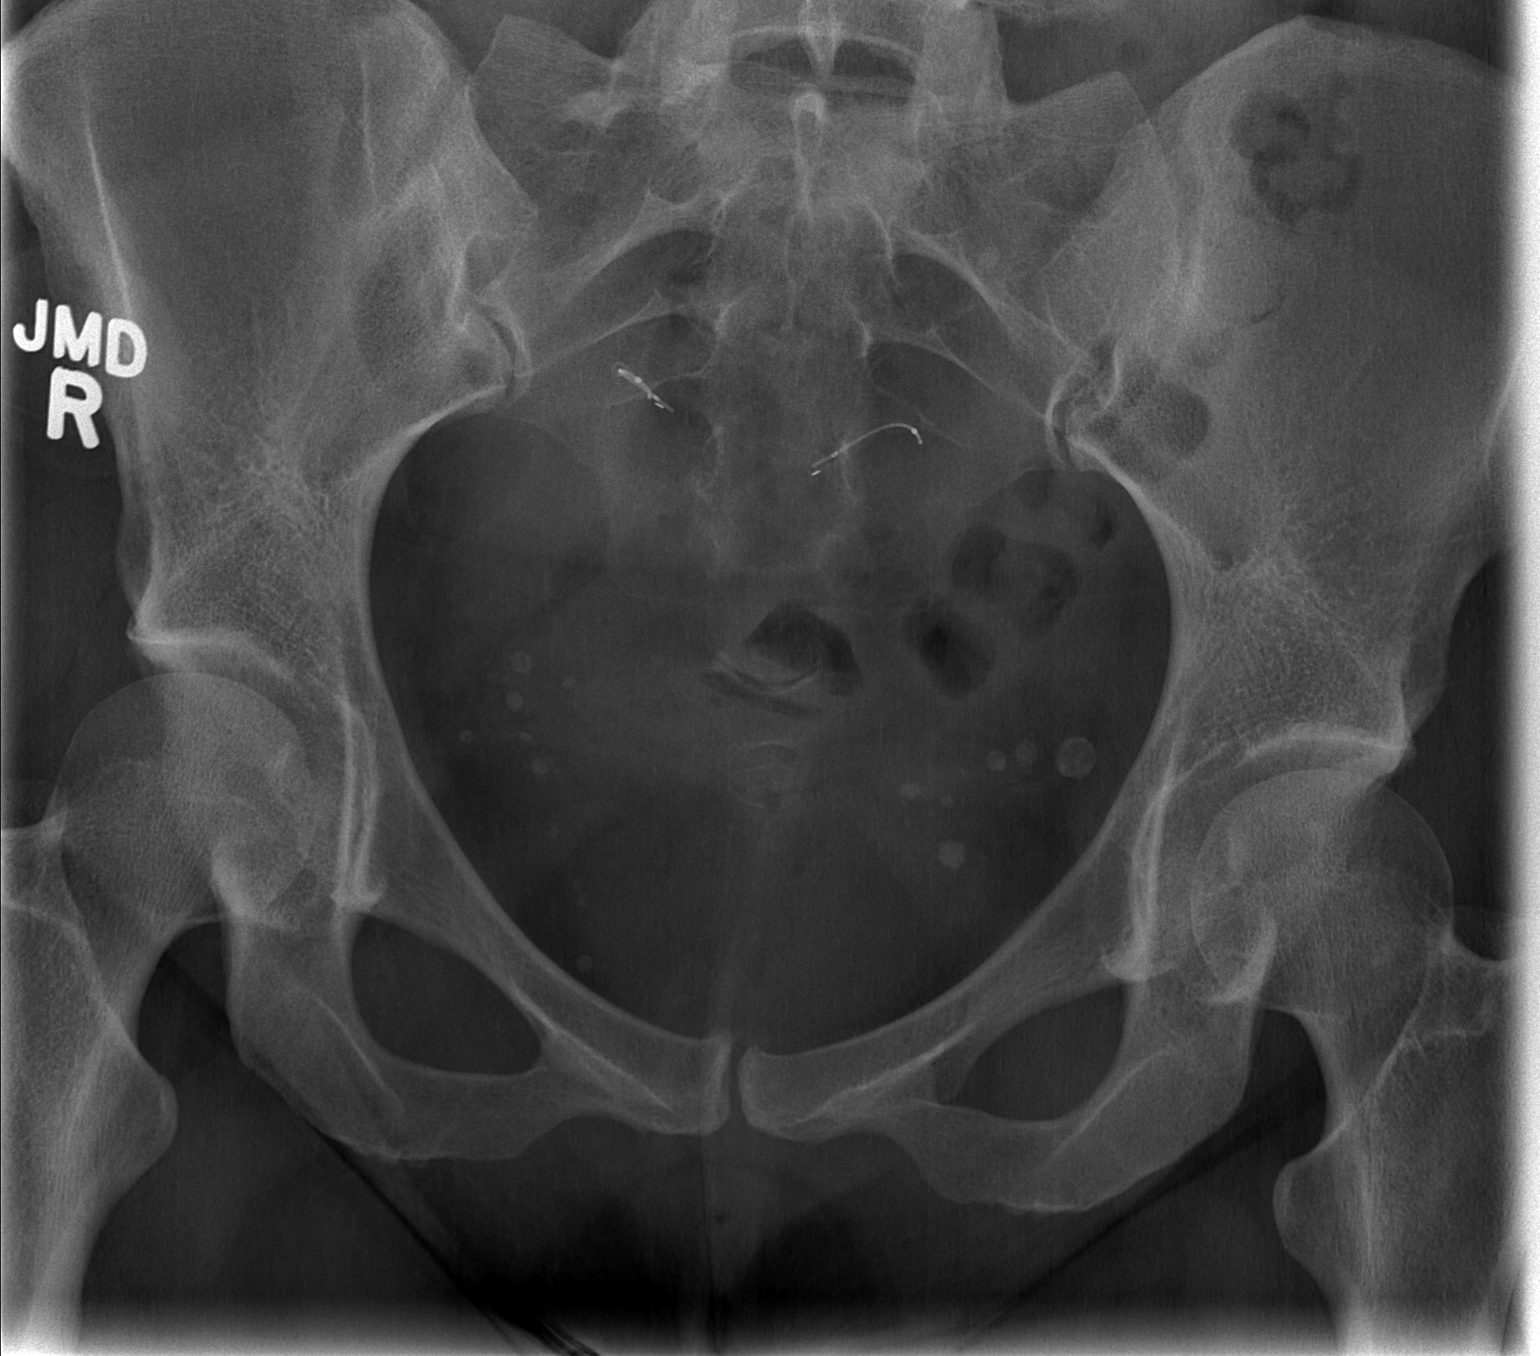

[2 of 2 positions shown; findings below may reference images not displayed]

FINDINGS: A previously demonstrated approximately 3-4 mm diameter stone lying
just superior to the left L2 transverse process is not clearly
evident today. There is a stable calcification projecting over the
left sacral ala. There are numerous pelvic phleboliths. No definite
stones within either kidney are observed. There are Essure
contraceptive devices present bilaterally. The bony structures are
unremarkable.
IMPRESSION: The patient may have passed the previously demonstrated left UPJ
stone. No definite residual stones are observed. There are numerous
pelvic calcifications which are stable and most compatible with
phleboliths.

## 2019-07-16 ENCOUNTER — Ambulatory Visit: Payer: BLUE CROSS/BLUE SHIELD | Admitting: Psychiatry

## 2019-07-30 ENCOUNTER — Ambulatory Visit: Payer: BLUE CROSS/BLUE SHIELD | Admitting: Psychiatry

## 2019-08-09 ENCOUNTER — Ambulatory Visit: Payer: BLUE CROSS/BLUE SHIELD | Admitting: Obstetrics & Gynecology

## 2019-08-29 ENCOUNTER — Other Ambulatory Visit: Payer: Self-pay | Admitting: Psychiatry

## 2019-09-20 ENCOUNTER — Other Ambulatory Visit: Payer: Self-pay | Admitting: Psychiatry

## 2019-09-26 ENCOUNTER — Telehealth: Payer: Self-pay

## 2019-09-26 MED ORDER — DULOXETINE HCL 30 MG PO CPEP: 30.0000 mg | ORAL_CAPSULE | Freq: Two times a day (BID) | ORAL | 0 refills | Status: DC

## 2019-09-26 MED ORDER — ARIPIPRAZOLE 5 MG PO TABS: 5.0000 mg | ORAL_TABLET | Freq: Every day | ORAL | 0 refills | Status: DC

## 2019-09-26 NOTE — Telephone Encounter (Signed)
Medication refill request - Telephone call with Mitzi Hansen, pharmacist at [facility] as pt is requesting refills of her Abilify and Cymbalta. Pt last seen by Dr. Gretel Acre 05/07/19 and canceled 07/28/19.  Informed pharmacist Dr. Gretel Acre is no longer at this practice and patient will need to call back to schedule a new appointment.  Agreed to send covering providers a message to see if they would give her a one time refill until could be seen.  Pharmacist reported he would communicate this to patient as she may have to be seen before any refills can be provided.

## 2019-09-26 NOTE — Telephone Encounter (Signed)
I have sent 30-day prescriptions to her pharmacy.  Please offer her appointment with me on November 30 at 2:30 PM.  Thank you.

## 2019-09-26 NOTE — Telephone Encounter (Signed)
Medication management - Messages left for patient on her mobile number and home number of need to get scheduled.  Requested pt call back to set up something with Dr. Toy Care for first available

## 2020-01-02 ENCOUNTER — Ambulatory Visit (INDEPENDENT_AMBULATORY_CARE_PROVIDER_SITE_OTHER): Payer: BLUE CROSS/BLUE SHIELD

## 2020-01-02 ENCOUNTER — Encounter (HOSPITAL_COMMUNITY): Payer: Self-pay

## 2020-01-02 ENCOUNTER — Ambulatory Visit (HOSPITAL_COMMUNITY)
Admission: EM | Admit: 2020-01-02 | Discharge: 2020-01-02 | Disposition: A | Discharge status: Home / Self Care | Payer: BLUE CROSS/BLUE SHIELD | Attending: Family Medicine | Admitting: Family Medicine

## 2020-01-02 ENCOUNTER — Other Ambulatory Visit: Payer: Self-pay

## 2020-01-02 DIAGNOSIS — S6702XA Crushing injury of left thumb, initial encounter: Secondary | ICD-10-CM

## 2020-01-02 DIAGNOSIS — W230XXA Caught, crushed, jammed, or pinched between moving objects, initial encounter: Secondary | ICD-10-CM

## 2020-01-02 DIAGNOSIS — S62522A Displaced fracture of distal phalanx of left thumb, initial encounter for closed fracture: Secondary | ICD-10-CM | POA: Diagnosis not present

## 2020-01-02 MED ORDER — HYDROCODONE-ACETAMINOPHEN 5-325 MG PO TABS: 1.0000 | ORAL_TABLET | Freq: Four times a day (QID) | ORAL | 0 refills | Status: DC | PRN

## 2020-01-02 NOTE — ED Triage Notes (Signed)
Pt states she closed her thumb up in the car door 20 minutes ago. ( left hand thumb )

## 2020-01-02 NOTE — Discharge Instructions (Addendum)

## 2020-01-03 NOTE — ED Provider Notes (Signed)
[name]   [id number] 01/02/20 Arrival Time: 1940  ASSESSMENT & PLAN:  1. Crushing injury of left thumb, initial encounter   2. Closed fracture of tuft of distal phalanx of left thumb     I have personally viewed the imaging studies ordered this visit. Tuft fx of distal left thumb.  Thumb gently cleaned. No open wounds requiring repair. Does have semi-permanent nail on but portion of natural nail visualized reveals no subungual hematoma.  Meds ordered this encounter  Medications  . HYDROcodone-acetaminophen (NORCO/VICODIN) 5-325 MG tablet    Sig: Take 1 tablet by mouth every 6 (six) hours as needed for moderate pain or severe pain.    Dispense:  10 tablet    Refill:  0    Orders Placed This Encounter  Procedures  . DG Finger Thumb Left  . Apply dressing    Recommend: Follow-up Information    Schedule an appointment as soon as possible for a visit  with Bradly Bienenstock, MD.   Specialty: Orthopedic Surgery Contact information: [address] [place] [address] [place] [address] [phone]            [place] Controlled Substances Registry consulted for this patient. I feel the risk/benefit ratio today is favorable for proceeding with this prescription for a controlled substance. Medication sedation precautions given.  Reviewed expectations re: course of current medical issues. Questions answered. Outlined signs and symptoms indicating need for more acute intervention. Patient verbalized understanding. After Visit Summary given.  SUBJECTIVE: History from: patient. Carolyn Parker is a 39 y.o. female who reports persistent marked pain of her left thumb; described as aching; without radiation. Onset: abrupt. First noted: approx 20-30 min ago. Injury/trama: reports shutting her thumb in her car door. Symptoms have progressed to a point and plateaued since beginning. Aggravating factors: "everything". Alleviating factors: have not been identified.  Associated symptoms: none reported. Extremity sensation changes or weakness: none. Self treatment: has not tried OTC therapies.  History of similar: no.  Past Surgical History:  Procedure Laterality Date  . COLONOSCOPY  10-06-2017  dr pyrtle  . CYSTOSCOPY WITH RETROGRADE PYELOGRAM, URETEROSCOPY AND STENT PLACEMENT Left 12/27/2017   Procedure: CYSTOSCOPY WITH RETROGRADE PYELOGRAM, URETEROSCOPY AND STENT PLACEMENT;  Surgeon: Sebastian Ache, MD;  Location: [facility];  Service: Urology;  Laterality: Left;  . CYSTOSCOPY/URETEROSCOPY/HOLMIUM LASER/STENT PLACEMENT Left 01/17/2018   Procedure: CYSTOSCOPY/URETEROSCOPY/STENT EXCHANGE;  Surgeon: Jerilee Field, MD;  Location[facility];  Service: Urology;  Laterality: Left;  . ESSURE TUBAL LIGATION Bilateral 2012  . EXTRACORPOREAL SHOCK WAVE LITHOTRIPSY Left 12/26/2017   Procedure: LEFT EXTRACORPOREAL SHOCK WAVE LITHOTRIPSY (ESWL);  Surgeon: Hildred Laser, MD;  Location: [facility];  Service: Urology;  Laterality: Left;  . LAPAROSCOPIC ENDOMETRIOSIS FULGURATION  2006      OBJECTIVE:  Vitals:   01/02/20 1958 01/02/20 2000  BP:  136/85  Pulse:  93  Resp:  18  Temp:  98.3 F (36.8 C)  TempSrc:  Oral  SpO2:  98%  Weight: 77.1 kg     General appearance: alert; no distress HEENT: [place]; AT Neck: supple with FROM Resp: unlabored respirations Extremities: . LUE: warm with well perfused appearance; poorly localized marked tenderness over left distal thumb; without gross deformities; swelling: minimal; bruising: none; thumb ROM: normal; small cuts over distal thumb that are slowly oozing blood - easily controlled Skin: warm and dry; no visible rashes Neurologic: gait normal; normal sensation of distal left thumb Psychological: alert and cooperative; normal mood and affect  Imaging:  DG Finger Thumb Left  Result Date: 01/02/2020 CLINICAL DATA:  Closed left thumb in car door with pain\, initial encounter EXAM: LEFT THUMB 2+V  COMPARISON:  None. FINDINGS: Undisplaced distal phalangeal tuft fracture is noted. No soft tissue abnormality is seen. No other focal abnormality is noted. IMPRESSION: Undisplaced distal phalangeal tuft fracture Electronically Signed   By: Inez Catalina M.D.   On: 01/02/2020 20:23      Allergies  Allergen Reactions  . Bee Venom Anaphylaxis    Uncoded Allergy. Allergen: bee sting  . Amoxicillin-Pot Clavulanate Nausea And Vomiting  . Beef-Derived Products Diarrhea and Nausea And Vomiting  . Dairy Aid [Lactase] Diarrhea and Nausea And Vomiting  . Pork-Derived Products Diarrhea and Nausea And Vomiting  . Sertraline Other (See Comments)    Other Reaction: -serotonin syndrome   . Shellfish Allergy Diarrhea and Nausea And Vomiting  . Wheat Bran Diarrhea and Nausea And Vomiting    Past Medical History:  Diagnosis Date  . Anxiety   . Chronic post-traumatic stress disorder (PTSD)    followed by dr Gretel Acre  . Complication of anesthesia    hard to wake  . Constipation   . Family history of breast cancer   . Genetic testing 12/2013   My Risk neg   . History of kidney stones   . Hydronephrosis, left   . Irritable bowel syndrome with diarrhea   . Kidney stone   . Left ureteral stone   . Major depressive disorder    followed by dr Gretel Acre [facility])  . Mild asthma    Social History   Socioeconomic History  . Marital status: Married    Spouse name: Not on file  . Number of children: 2  . Years of education: Not on file  . Highest education level: Not on file  Occupational History  . Occupation: Ship broker  Tobacco Use  . Smoking status: Former Smoker    Years: 10.00    Types: Cigarettes    Start date: 05/20/2000    Quit date: 05/20/2010    Years since quitting: 9.6  . Smokeless tobacco: Never Used  Substance and Sexual Activity  . Alcohol use: Yes    Alcohol/week: 0.0 standard drinks    Comment: socially  . Drug use: Yes    Frequency: 7.0 times per week    Types: Marijuana     Comment: uses daily  . Sexual activity: Yes    Partners: Male    Birth control/protection: Surgical    Comment: Essure Bilateral Tubal Ligation 2012  Other Topics Concern  . Not on file  Social History Narrative  . Not on file   Social Determinants of Health   Financial Resource Strain:   . Difficulty of Paying Living Expenses: Not on file  Food Insecurity:   . Worried About Charity fundraiser in the Last Year: Not on file  . Ran Out of Food in the Last Year: Not on file  Transportation Needs:   . Lack of Transportation (Medical): Not on file  . Lack of Transportation (Non-Medical): Not on file  Physical Activity:   . Days of Exercise per Week: Not on file  . Minutes of Exercise per Session: Not on file  Stress:   . Feeling of Stress : Not on file  Social Connections:   . Frequency of Communication with Friends and Family: Not on file  . Frequency of Social Gatherings with Friends and Family: Not on file  . Attends Religious Services: Not on  file  . Active Member of Clubs or Organizations: Not on file  . Attends Banker Meetings: Not on file  . Marital Status: Not on file   Family History  Problem Relation Age of Onset  . Anxiety disorder Mother   . Depression Mother   . Diabetes Mother   . Heart Problems Mother   . Colon cancer Mother 37  . Hypertension Father   . Bipolar disorder Sister   . Colon cancer Maternal Grandmother 80  . Breast cancer Maternal Aunt 30   Past Surgical History:  Procedure Laterality Date  . COLONOSCOPY  10-06-2017  dr pyrtle  . CYSTOSCOPY WITH RETROGRADE PYELOGRAM, URETEROSCOPY AND STENT PLACEMENT Left 12/27/2017   Procedure: CYSTOSCOPY WITH RETROGRADE PYELOGRAM, URETEROSCOPY AND STENT PLACEMENT;  Surgeon: Sebastian Ache, MD;  Location[facility];  Service: Urology;  Laterality: Left;  . CYSTOSCOPY/URETEROSCOPY/HOLMIUM LASER/STENT PLACEMENT Left 01/17/2018   Procedure: CYSTOSCOPY/URETEROSCOPY/STENT EXCHANGE;  Surgeon: Jerilee Field, MD;  Location: [facility];  Service: Urology;  Laterality: Left;  . ESSURE TUBAL LIGATION Bilateral 2012  . EXTRACORPOREAL SHOCK WAVE LITHOTRIPSY Left 12/26/2017   Procedure: LEFT EXTRACORPOREAL SHOCK WAVE LITHOTRIPSY (ESWL);  Surgeon: Hildred Laser, MD;  [facility];  Service: Urology;  Laterality: Left;  . LAPAROSCOPIC ENDOMETRIOSIS FULGURATION  2006      Mardella Layman, MD 01/03/20 [phone]

## 2023-05-16 ENCOUNTER — Other Ambulatory Visit (HOSPITAL_COMMUNITY): Payer: Self-pay | Admitting: Family Medicine

## 2023-05-16 DIAGNOSIS — R030 Elevated blood-pressure reading, without diagnosis of hypertension: Secondary | ICD-10-CM

## 2023-05-27 ENCOUNTER — Ambulatory Visit (HOSPITAL_COMMUNITY)
Admission: RE | Admit: 2023-05-27 | Discharge: 2023-05-27 | Disposition: A | Discharge status: Home / Self Care | Payer: BC Managed Care – PPO | Source: Ambulatory Visit | Attending: Family Medicine | Admitting: Family Medicine

## 2023-05-27 DIAGNOSIS — R030 Elevated blood-pressure reading, without diagnosis of hypertension: Secondary | ICD-10-CM | POA: Insufficient documentation

## 2023-06-28 NOTE — Progress Notes (Signed)
Sleep Medicine   Office Visit  Patient Name: Carolyn Parker DOB: February 18, 1981 MRN [id number]    Chief Complaint: Sleep consult  Brief History:  Carolyn Parker presents for an initial consult for sleep evaluation and to establish care. Patient has several years history of excessive daytime sleepiness and is progressing more as of late. Patient is concerned of excessive sleepiness at work and while driving. Sleep quality is fair. This is noted most nights. Patient reports waking up feeling refreshed but will end up with excessive daytime sleepiness within a few hours.The patient's bed partner reports occasional snoring at night. The patient relates the following symptoms: excessive fatigue at random times, some migraines are also present. The patient goes to sleep at 0930 pm and wakes up at 0530 am and will wake up several times in between.  Sleep quality is the same when outside home environment.  Patient has noted significant movement of her legs at night that would disrupt her sleep.  The patient  relates sleep talking, sleep screaming, sleep growling, sleep paralysis,  sleep walking as unusual behavior during the night.  The patient relates ADHD, Bipolar disorder, PTSD, depression, anxiety and Serotonin syndrome as a history of psychiatric problems. The Epworth Sleepiness Score is 18 out of 24 .  The patient relates  Cardiovascular risk factors include: HTN, hx of myocarditis. Does see cardiology. The patient reports she had insomnia as a teen but then moved out of home and got some medications and instead went to hypersomnia. Then had 5-8 years of sleeping well. Had second kid in 2012 and was dx with chronic fatigue syndrome and lasted about 2 years until she started cymbalta and this helped for awhile. Then cymbalta stopped helping the depression and had to stop this and fatigue came back a little but not as bad and was manageable. Then in 2022 she split from husband and that is about the time sleep  got worse and meds were changed around a lot around same time. About 1.25 years ago started having episodes of not being able to stay asleep. Will have to sleep to 2 hours, can happen in AM or early PM, but is sporadic as to when this occurs and may have days without any problem. In evening if she relaxes then she can fall asleep. She will nap for with alarm and some days is refreshed, other days has to set another 15 mins.    ROS  General: (-) fever, (-) chills, (-) night sweat Nose and Sinuses: (-) nasal stuffiness or itchiness, (-) postnasal drip, (-) nosebleeds, (-) sinus trouble. Mouth and Throat: (-) sore throat, (-) hoarseness. Neck: (-) swollen glands, (-) enlarged thyroid, (-) neck pain. Respiratory: + cough, - shortness of breath, - wheezing. Neurologic: + numbness, + tingling. Psychiatric: + anxiety, - depression Sleep behavior: +sleep paralysis -hypnogogic hallucinations +dream enactment      -vivid dreams -cataplexy -night terrors +sleep walking   Current Medication: Outpatient Encounter Medications as of 06/29/2023  Medication Sig   FLUoxetine (PROZAC) 10 MG capsule Take 10 mg by mouth daily.   gabapentin (NEURONTIN) 300 MG capsule    amLODipine (NORVASC) 5 MG tablet Take 1 tablet by mouth daily.   ARIPiprazole (ABILIFY) 2 MG tablet Take 0.5 tablets by mouth every morning.   meloxicam (MOBIC) 15 MG tablet TAKE ONE TABLET BY MOUTH EVERY MORNING FOR 21 DAYS   metFORMIN (GLUCOPHAGE-XR) 500 MG 24 hr tablet TAKE 1 TABLET BY MOUTH ONCE DAILY WITH EVENING MEAL FOR 90  DAYS   olmesartan (BENICAR) 5 MG tablet TAKE 1 TABLET BY MOUTH DAILY FOR BLOOD PRESSURE 30 DAYS   oxybutynin (DITROPAN-XL) 5 MG 24 hr tablet Take 5 mg by mouth daily.   [DISCONTINUED] ARIPiprazole (ABILIFY) 5 MG tablet Take 1 tablet (5 mg total) by mouth daily.   [DISCONTINUED] diphenoxylate-atropine (LOMOTIL) 2.5-0.025 MG tablet Take by mouth as needed for diarrhea or loose stools.   [DISCONTINUED]  DULoxetine (CYMBALTA) 30 MG capsule Take 1 capsule (30 mg total) by mouth 2 (two) times daily.   [DISCONTINUED] Eluxadoline (VIBERZI) 75 MG TABS Take 1 tablet by mouth 2 (two) times daily.   [DISCONTINUED] HYDROcodone-acetaminophen (NORCO/VICODIN) 5-325 MG tablet Take 1 tablet by mouth every 6 (six) hours as needed for moderate pain or severe pain.   [DISCONTINUED] nitrofurantoin, macrocrystal-monohydrate, (MACROBID) 100 MG capsule    No facility-administered encounter medications on file as of 06/29/2023.    Surgical History: Past Surgical History:  Procedure Laterality Date   COLONOSCOPY  10-06-2017  dr pyrtle   CYSTOSCOPY WITH RETROGRADE PYELOGRAM, URETEROSCOPY AND STENT PLACEMENT Left 12/27/2017   Procedure: CYSTOSCOPY WITH RETROGRADE PYELOGRAM, URETEROSCOPY AND STENT PLACEMENT;  Surgeon: Sebastian Ache, MD;  Location: [facility];  Service: Urology;  Laterality: Left;   CYSTOSCOPY/URETEROSCOPY/HOLMIUM LASER/STENT PLACEMENT Left 01/17/2018   Procedure: CYSTOSCOPY/URETEROSCOPY/STENT EXCHANGE;  Surgeon: Jerilee Field, MD;  Location: [facility];  Service: Urology;  Laterality: Left;   ESSURE TUBAL LIGATION Bilateral 2012   EXTRACORPOREAL SHOCK WAVE LITHOTRIPSY Left 12/26/2017   Procedure: LEFT EXTRACORPOREAL SHOCK WAVE LITHOTRIPSY (ESWL);  Surgeon: Hildred Laser, MD;  Location: [facility];  Service: Urology;  Laterality: Left;   LAPAROSCOPIC ENDOMETRIOSIS FULGURATION  2006    Medical History: Past Medical History:  Diagnosis Date   Anxiety    Chronic post-traumatic stress disorder (PTSD)    followed by dr Garnetta Buddy   Complication of anesthesia    hard to wake   Constipation    Family history of breast cancer    Genetic testing 12/2013   My Risk neg    History of kidney stones    Hydronephrosis, left    Irritable bowel syndrome with diarrhea    Kidney stone    Left ureteral stone    Major depressive disorder    followed by dr Garnetta Buddy [facility])   Mild asthma      Family History: Non contributory to the present illness  Social History: Social History   Socioeconomic History   Marital status: Legally Separated    Spouse name: Not on file   Number of children: 2   Years of education: Not on file   Highest education level: Not on file  Occupational History   Occupation: student  Tobacco Use   Smoking status: Former    Current packs/day: 0.00    Types: Cigarettes    Start date: 05/20/2000    Quit date: 05/20/2010    Years since quitting: 13.1   Smokeless tobacco: Never  Vaping Use   Vaping status: Never Used  Substance and Sexual Activity   Alcohol use: Yes    Alcohol/week: 0.0 standard drinks of alcohol    Comment: socially   Drug use: Yes    Frequency: 7.0 times per week    Types: Marijuana    Comment: uses daily   Sexual activity: Yes    Partners: Male    Birth control/protection: Surgical    Comment: Essure Bilateral Tubal Ligation 2012  Other Topics Concern   Not on file  Social History  Narrative   Not on file   Social Determinants of Health   Financial Resource Strain: Not on file  Food Insecurity: Not on file  Transportation Needs: Not on file  Physical Activity: Not on file  Stress: Not on file  Social Connections: Not on file  Intimate Partner Violence: Not on file    Vital Signs: Blood pressure 138/87, pulse 80, resp. rate 18, height 5' (1.524 m), weight 165 lb (74.8 kg), SpO2 97%. Body mass index is 32.22 kg/m.   Examination: General Appearance: The patient is well-developed, well-nourished, and in no distress. Neck Circumference: 37 cm Skin: Gross inspection of skin unremarkable. Head: normocephalic, no gross deformities. Eyes: no gross deformities noted. ENT: ears appear grossly normal Neurologic: Alert and oriented. No involuntary movements.    STOP BANG RISK ASSESSMENT S (snore) Have you been told that you snore?     YES   T (tired) Are you often tired, fatigued, or sleepy during the day?    YES  O (obstruction) Do you stop breathing, choke, or gasp during sleep? NO   P (pressure) Do you have or are you being treated for high blood pressure? YES   B (BMI) Is your body index greater than 35 kg/m? NO   A (age) Are you 21 years old or older? NO   N (neck) Do you have a neck circumference greater than 16 inches?   NO   G (gender) Are you a female? NO   TOTAL STOP/BANG "YES" ANSWERS 3                                                               A STOP-Bang score of 2 or less is considered low risk, and a score of 5 or more is high risk for having either moderate or severe OSA. For people who score 3 or 4, doctors may need to perform further assessment to determine how likely they are to have OSA.         EPWORTH SLEEPINESS SCALE:  Scale:  (0)= no chance of dozing; (1)= slight chance of dozing; (2)= moderate chance of dozing; (3)= high chance of dozing  Chance  Situtation    Sitting and reading: 3    Watching TV: 3    Sitting Inactive in public: 2    As a passenger in car: 3      Lying down to rest: 3    Sitting and talking: 2    Sitting quielty after lunch: 1    In a car, stopped in traffic: 1   TOTAL SCORE:   18 out of 24    SLEEP STUDIES:  None   LABS: No results found for this or any previous visit (from the past 2160 hour(s)).  Radiology: VAS US RENAL ARTERY DUPLEX  Result Date: 05/27/2023 ABDOMINAL VISCERAL Patient Name:  MARIZA HOVAN  Date of Exam:   05/27/2023 Medical Rec #: [id number]                Accession #:    [id number] Date of Birth: Jul 02, 1981                Patient Gender: F Patient Age:   29 years Exam Location:  [facility] Procedure:  VAS US RENAL ARTERY DUPLEX Referring Phys: [id number] Penni Homans P MULLIS -------------------------------------------------------------------------------- Indications: Hypertension High Risk Factors: Past history of smoking. Comparison Study: No previous exams Performing Technologist:  Jody Hill RVT, RDMS  Examination Guidelines: A complete evaluation includes B-mode imaging, spectral Doppler, color Doppler, and power Doppler as needed of all accessible portions of each vessel. Bilateral testing is considered an integral part of a complete examination. Limited examinations for reoccurring indications may be performed as noted.  Duplex Findings: +--------------------+--------+--------+------+--------+ Mesenteric          PSV cm/sEDV cm/sPlaqueComments +--------------------+--------+--------+------+--------+ Aorta Prox             97      20                  +--------------------+--------+--------+------+--------+ Celiac Artery Origin  167      38                  +--------------------+--------+--------+------+--------+ SMA Proximal          189      39                  +--------------------+--------+--------+------+--------+    +------------------+--------+--------+-------+ Right Renal ArteryPSV cm/sEDV cm/sComment +------------------+--------+--------+-------+ Origin              143      62           +------------------+--------+--------+-------+ Proximal            117      34           +------------------+--------+--------+-------+ Mid                 124      34           +------------------+--------+--------+-------+ Distal              122      29           +------------------+--------+--------+-------+ +-----------------+--------+--------+-------+ Left Renal ArteryPSV cm/sEDV cm/sComment +-----------------+--------+--------+-------+ Origin             111      46           +-----------------+--------+--------+-------+ Proximal           131      53           +-----------------+--------+--------+-------+ Mid                 96      39           +-----------------+--------+--------+-------+ Distal             125      36           +-----------------+--------+--------+-------+  +------------+--------+--------+----+-----------+--------+--------+----+ Right KidneyPSV cm/sEDV cm/sRI  Left KidneyPSV cm/sEDV cm/sRI   +------------+--------+--------+----+-----------+--------+--------+----+ Upper Pole  25      8       0.66Upper Pole 21      7       0.65 +------------+--------+--------+----+-----------+--------+--------+----+ Mid         33      13      0.        22      9       0.58 +------------+--------+--------+----+-----------+--------+--------+----+ Lower Pole  23      9       0.61Lower Pole 33      11      0.66 +------------+--------+--------+----+-----------+--------+--------+----+ Hilar  46      18      0.62Hilar      46      19      0.59 +------------+--------+--------+----+-----------+--------+--------+----+ +------------------+---------+------------------+---------+ Right Kidney               Left Kidney                 +------------------+---------+------------------+---------+ RAR                        RAR                         +------------------+---------+------------------+---------+ RAR (manual)      1.47     RAR (manual)      1.35      +------------------+---------+------------------+---------+ Cortex            21/7 0.65Cortex            21/9 0.57 +------------------+---------+------------------+---------+ Cortex thickness           Corex thickness             +------------------+---------+------------------+---------+ Kidney length (cm)11.96    Kidney length (cm)10.73     +------------------+---------+------------------+---------+  Summary: Renal:  Right: No evidence of right renal artery stenosis. Normal right        Resisitive Index. Normal size right kidney. RRV flow present.        Cyst noted in lower pole measuring 1.35 x 1.39 x 1.57 cm. Left:  No evidence of left renal artery stenosis. Normal left        Resistive Index. Normal size of left kidney. LRV flow        present. Mesenteric:  Normal Celiac artery and Superior Mesenteric artery findings.  *See table(s) above for measurements and observations.  Diagnosing physician: Waverly Ferrari MD  Electronically signed by Waverly Ferrari MD on 05/27/2023 at 4:15:53 PM.    Final     No results found.  No results found.    Assessment and Plan: Patient Active Problem List   Diagnosis Date Noted   Irritable bowel syndrome with diarrhea 04/27/2018   Nausea 04/27/2018   Encounter for dental examination 03/09/2018   Nephrolith 12/27/2017   Change in bowel habits 10/05/2017   Loss of weight 10/05/2017   Generalized abdominal pain 10/05/2017   B12 deficiency 10/17/2014     PLAN:  Patient evaluation suggests high risk of sleep disordered breathing due to snoring, sleep walking, some sleep paralysis, excessive daytime sleepiness, and elevated BMI. Patient has comorbid cardiovascular risk factors including: HTN which could be exacerbated by pathologic sleep-disordered breathing.  Suggest: PSG with MSLT to assess/treat the patient's sleep disordered breathing. The patient was also counselled on wt loss to optimize sleep health.   1. Hypersomnia Will order PSG with MSLT due to some concern for narcolepsy though symptoms are somewhat atypical. Pt given 2 week sleep diary to bring to study and is aware of needing to hold psych meds for 2 weeks prior and will discuss tapering with psych.  2. Essential hypertension Continue current medication and f/u with PCP.  3. Anxiety Will discuss tapering with psych  4. Obesity (BMI 30.0-34.9) Obesity Counseling: Had a lengthy discussion regarding patients BMI and weight issues. Patient was instructed on portion control as well as increased activity. Also discussed caloric restrictions with trying to maintain intake less than 2000 Kcal. Discussions were made in accordance with the 5As of weight management. Simple  actions such as not eating late and if able to, taking a walk is  suggested.    General Counseling: I have discussed the findings of the evaluation and examination with Norvell.  I have also discussed any further diagnostic evaluation thatmay be needed or ordered today. Laurette verbalizes understanding of the findings of todays visit. We also reviewed her medications today and discussed drug interactions and side effects including but not limited excessive drowsiness and altered mental states. We also discussed that there is always a risk not just to her but also people around her. she has been encouraged to call the office with any questions or concerns that should arise related to todays visit.  No orders of the defined types were placed in this encounter.       I have personally obtained a history, evaluated the patient, evaluated pertinent data, formulated the assessment and plan and placed orders.  This patient was seen by Lynn Ito, PA-C in collaboration with Dr. Freda Munro as a part of collaborative care agreement.    Yevonne Pax, MD [facility] Diplomate ABMS Pulmonary and Critical Care Medicine Sleep medicine

## 2023-06-29 ENCOUNTER — Ambulatory Visit (INDEPENDENT_AMBULATORY_CARE_PROVIDER_SITE_OTHER): Payer: BLUE CROSS/BLUE SHIELD | Admitting: Internal Medicine

## 2023-06-29 VITALS — BP 138/87 | HR 80 | Resp 18 | Ht 60.0 in | Wt 165.0 lb

## 2023-06-29 DIAGNOSIS — I10 Essential (primary) hypertension: Secondary | ICD-10-CM

## 2023-06-29 DIAGNOSIS — G4710 Hypersomnia, unspecified: Secondary | ICD-10-CM

## 2023-06-29 DIAGNOSIS — F419 Anxiety disorder, unspecified: Secondary | ICD-10-CM | POA: Diagnosis not present

## 2023-06-29 DIAGNOSIS — E669 Obesity, unspecified: Secondary | ICD-10-CM | POA: Diagnosis not present

## 2023-07-07 ENCOUNTER — Other Ambulatory Visit: Payer: Self-pay | Admitting: Oncology

## 2023-07-07 DIAGNOSIS — Z006 Encounter for examination for normal comparison and control in clinical research program: Secondary | ICD-10-CM

## 2023-08-11 ENCOUNTER — Other Ambulatory Visit (INDEPENDENT_AMBULATORY_CARE_PROVIDER_SITE_OTHER): Payer: BLUE CROSS/BLUE SHIELD

## 2023-08-11 ENCOUNTER — Encounter: Payer: Self-pay | Admitting: Nurse Practitioner

## 2023-08-11 ENCOUNTER — Ambulatory Visit: Payer: BLUE CROSS/BLUE SHIELD | Admitting: Nurse Practitioner

## 2023-08-11 VITALS — BP 118/80 | HR 79 | Ht 60.0 in | Wt 169.0 lb

## 2023-08-11 DIAGNOSIS — R1013 Epigastric pain: Secondary | ICD-10-CM

## 2023-08-11 DIAGNOSIS — R112 Nausea with vomiting, unspecified: Secondary | ICD-10-CM

## 2023-08-11 DIAGNOSIS — K529 Noninfective gastroenteritis and colitis, unspecified: Secondary | ICD-10-CM | POA: Diagnosis not present

## 2023-08-11 DIAGNOSIS — Z800 Family history of malignant neoplasm of digestive organs: Secondary | ICD-10-CM

## 2023-08-11 LAB — COMPREHENSIVE METABOLIC PANEL
ALT: 13 U/L (ref 0–35)
AST: 12 U/L (ref 0–37)
Albumin: 4.7 g/dL (ref 3.5–5.2)
Alkaline Phosphatase: 67 U/L (ref 39–117)
BUN: 11 mg/dL (ref 6–23)
CO2: 29 meq/L (ref 19–32)
Calcium: 9.8 mg/dL (ref 8.4–10.5)
Chloride: 102 meq/L (ref 96–112)
Creatinine, Ser: 0.78 mg/dL (ref 0.40–1.20)
GFR: 95.28 mL/min (ref >60.00)
Glucose, Bld: 98 mg/dL (ref 70–99)
Potassium: 4.5 meq/L (ref 3.5–5.1)
Sodium: 138 meq/L (ref 135–145)
Total Bilirubin: 0.3 mg/dL (ref 0.2–1.2)
Total Protein: 7.4 g/dL (ref 6.0–8.3)

## 2023-08-11 LAB — CBC WITH DIFFERENTIAL/PLATELET
Basophils Absolute: 0.1 10*3/uL (ref 0.0–0.1)
Basophils Relative: 0.7 % (ref 0.0–3.0)
Eosinophils Absolute: 0.2 10*3/uL (ref 0.0–0.7)
Eosinophils Relative: 2.4 % (ref 0.0–5.0)
HCT: 42.6 % (ref 36.0–46.0)
Hemoglobin: 13.8 g/dL (ref 12.0–15.0)
Lymphocytes Relative: 34.2 % (ref 12.0–46.0)
Lymphs Abs: 3.5 10*3/uL (ref 0.7–4.0)
MCHC: 32.4 g/dL (ref 30.0–36.0)
MCV: 87.5 fL (ref 78.0–100.0)
Monocytes Absolute: 0.4 10*3/uL (ref 0.1–1.0)
Monocytes Relative: 4.2 % (ref 3.0–12.0)
Neutro Abs: 5.9 10*3/uL (ref 1.4–7.7)
Neutrophils Relative %: 58.5 % (ref 43.0–77.0)
Platelets: 365 10*3/uL (ref 150.0–400.0)
RBC: 4.87 Mil/uL (ref 3.87–5.11)
RDW: 13.3 % (ref 11.5–15.5)
WBC: 10.2 10*3/uL (ref 4.0–10.5)

## 2023-08-11 LAB — TSH: TSH: 1.33 u[IU]/mL (ref 0.35–5.50)

## 2023-08-11 LAB — LIPASE: Lipase: 19 U/L (ref 11.0–59.0)

## 2023-08-11 MED ORDER — PLENVU 140 G PO SOLR: 1.0000 | ORAL | 0 refills | Status: DC

## 2023-08-11 MED ORDER — ESOMEPRAZOLE MAGNESIUM 20 MG PO CPDR: 20.0000 mg | DELAYED_RELEASE_CAPSULE | Freq: Every day | ORAL | 1 refills | Status: AC

## 2023-08-11 NOTE — Progress Notes (Signed)
08/11/2023 Carolyn Parker [id number] 1981-04-21   Chief Complaint: Discuss scheduling a colonoscopy  History of Present Illness: Carolyn Parker is a 42 year old female with a past medial history of anxiety, depression, PTSD, kidney stones, chronic UTIs, nausea, C. difficile and diarrhea predominant IBS. She is known by Dr. Rhea Belton. She presents to our office today as referred by Dr. Orpah Cobb to schedule a colonoscopy.  She underwent screening colonoscopy 10/06/2017 which was normal. Mother died from colon cancer at the age of 31 and maternal grandmother was diagnosed with colon cancer at the age of 43.  She endorses having chronic nausea, vomiting and diarrhea for 5 + years.  Her GI symptoms are variable and go up and down like a roller coaster. She has nausea daily and vomits nonbloody bilious emesis twice weekly, no specific food triggers. She takes Tums for nausea with mild symptom relief. She has infrequent heartburn and no dysphagia. She has generalized abdominal discomfort precedes a diarrhea bowel movement and sometimes she has random abdominal pains related to defecation.  She passes 1-10 soft to loose bowel movements daily.  She might pass a solid stool once every few months.  She saw a small amount of blood mixed in a bowel movement in July or August x 1 episode without recurrence.  She smokes marijuana daily and stated her GI symptoms started prior to smoking marijuana.  She stopped smoking marijuana for about 1 week in the past and she continued to have N/V and diarrhea. She does not wish to discontinue marijuana use at this time.  She took Meloxicam for back pain for 3 months and stopped taking it 1 or 2 months ago.  She started taking metformin extended release for prediabetes 6 weeks ago and her IBS-D symptoms have not worsened since starting this medication.  She developed chest pain and elevated blood pressure following a wasp bite on her right hand 05/05/2023 which  resulted in a cardiac evaluation including a coronary CTA 06/02/2023 which was negative, no evidence of coronary artery disease. No further CP.   PAST GI PROCEDURES:  Colonoscopy 10/06/2017: - The examined portion of the ileum was normal.  - The entire examined colon is normal. Biopsied.  - The distal rectum and anal verge are normal on retroflexion view. -Colonoscopy at the age of 42 secondary to family history of colon cancer.  Current Outpatient Medications on File Prior to Visit  Medication Sig Dispense Refill   amLODipine (NORVASC) 5 MG tablet Take 1 tablet by mouth daily.     ARIPiprazole (ABILIFY) 2 MG tablet Take 0.5 tablets by mouth every morning.     FLUoxetine (PROZAC) 10 MG capsule Take 10 mg by mouth daily.     gabapentin (NEURONTIN) 300 MG capsule      metFORMIN (GLUCOPHAGE-XR) 500 MG 24 hr tablet TAKE 1 TABLET BY MOUTH ONCE DAILY WITH EVENING MEAL FOR 90 DAYS     olmesartan (BENICAR) 5 MG tablet TAKE 1 TABLET BY MOUTH DAILY FOR BLOOD PRESSURE 30 DAYS     oxybutynin (DITROPAN-XL) 5 MG 24 hr tablet Take 5 mg by mouth daily.     meloxicam (MOBIC) 15 MG tablet TAKE ONE TABLET BY MOUTH EVERY MORNING FOR 21 DAYS (Patient not taking: Reported on 08/11/2023)     No current facility-administered medications on file prior to visit.   Allergies  Allergen Reactions   Bee Venom Anaphylaxis    Uncoded Allergy. Allergen: bee sting   Amoxicillin-Pot Clavulanate Nausea And  Vomiting   Beef-Derived Products Diarrhea and Nausea And Vomiting   Dairy Aid [Tilactase] Diarrhea and Nausea And Vomiting   Pork-Derived Products Diarrhea and Nausea And Vomiting   Sertraline Other (See Comments)    Other Reaction: -serotonin syndrome    Shellfish Allergy Diarrhea and Nausea And Vomiting   Wheat Diarrhea and Nausea And Vomiting   Current Outpatient Medications on File Prior to Visit  Medication Sig Dispense Refill   amLODipine (NORVASC) 5 MG tablet Take 1 tablet by mouth daily.     ARIPiprazole  (ABILIFY) 2 MG tablet Take 0.5 tablets by mouth every morning.     FLUoxetine (PROZAC) 10 MG capsule Take 10 mg by mouth daily.     gabapentin (NEURONTIN) 300 MG capsule      metFORMIN (GLUCOPHAGE-XR) 500 MG 24 hr tablet TAKE 1 TABLET BY MOUTH ONCE DAILY WITH EVENING MEAL FOR 90 DAYS     olmesartan (BENICAR) 5 MG tablet TAKE 1 TABLET BY MOUTH DAILY FOR BLOOD PRESSURE 30 DAYS     oxybutynin (DITROPAN-XL) 5 MG 24 hr tablet Take 5 mg by mouth daily.     meloxicam (MOBIC) 15 MG tablet TAKE ONE TABLET BY MOUTH EVERY MORNING FOR 21 DAYS (Patient not taking: Reported on 08/11/2023)     No current facility-administered medications on file prior to visit.   Allergies  Allergen Reactions   Bee Venom Anaphylaxis    Uncoded Allergy. Allergen: bee sting   Amoxicillin-Pot Clavulanate Nausea And Vomiting   Beef-Derived Products Diarrhea and Nausea And Vomiting   Dairy Aid [Tilactase] Diarrhea and Nausea And Vomiting   Pork-Derived Products Diarrhea and Nausea And Vomiting   Sertraline Other (See Comments)    Other Reaction: -serotonin syndrome    Shellfish Allergy Diarrhea and Nausea And Vomiting   Wheat Diarrhea and Nausea And Vomiting   Current Medications, Allergies, Past Medical History, Past Surgical History, Family History and Social History were reviewed in [employer] record.  Review of Systems:   Constitutional: Negative for fever, sweats, chills or weight loss.  Respiratory: Negative for shortness of breath.   Cardiovascular: Negative for chest pain, palpitations and leg swelling.  Gastrointestinal: See HPI.  Musculoskeletal: Negative for back pain or muscle aches.  Neurological: Negative for dizziness, headaches or paresthesias.   Physical Exam: BP 118/80   Pulse 79   Ht 5' (1.524 m)   Wt 169 lb (76.7 kg)   BMI 33.01 kg/m  General: 42 year old female in no acute distress. Head: Normocephalic and atraumatic. Eyes: No scleral icterus. Conjunctiva pink  . Ears: Normal auditory acuity. Mouth: Dentition intact. No ulcers or lesions.  Lungs: Clear throughout to auscultation. Heart: Regular rate and rhythm, no murmur. Abdomen: Soft, nontender and nondistended. No masses or hepatomegaly. Normal bowel sounds x 4 quadrants.  Rectal: Deferred. Musculoskeletal: Symmetrical with no gross deformities. Extremities: No edema. Neurological: Alert oriented x 4. No focal deficits.  Psychological: Alert and cooperative. Normal mood and affect  Assessment and Recommendations:  42 year old female presents to schedule a colonoscopy.  Mother and maternal grandmother with a history of colon cancer.  Normal colonoscopy 10/06/2017. -Colonoscopy benefits and risks discussed including risk with sedation, risk of bleeding, perforation and infection   Chronic nausea and vomiting -CBC, CMP and lipase level -RUQ sonogram to assess the gallbladder  -EGD at time of colonoscopy benefits and risks discussed including risk with sedation, risk of bleeding, perforation and infection  -Patient instructed to take Zofran 30 minutes prior to each bowel prep  dose -Discussed marijuana cessation -Esomeprazole 20mg  one tab po every day, patient to stop if diarrhea worsens after taking it  IBS-D with variable abdominal pains. Colonoscopy 10/2017 without evidence of colitis.  -Colonoscopy as ordered above  -TSH, TtG, IgA  Further recommendations and follow-up to be determined after the above evaluation completed

## 2023-08-11 NOTE — Progress Notes (Signed)
Addendum: Reviewed and agree with assessment and management plan. Kadijah Shamoon M, MD  

## 2023-08-11 NOTE — Patient Instructions (Addendum)
You have been scheduled for an endoscopy and colonoscopy. Please follow the written instructions given to you at your visit today.  Please pick up your prep supplies at the pharmacy within the next 1-3 days.  If you use inhalers (even only as needed), please bring them with you on the day of your procedure. ______________________________________________________ You have been scheduled for an abdominal ultrasound at [facility] (1st floor of hospital) on ____________ at _______________. Please arrive 30 minutes prior to your appointment for registration. Make certain not to have anything to eat or drink 6 hours prior to your appointment. Should you need to reschedule your appointment, please contact radiology at [phone]. This test typically takes about 30 minutes to perform.   Your provider has requested that you go to the basement level for lab work before leaving today. Press "B" on the elevator. The lab is located at the first door on the left as you exit the elevator.  Due to recent changes in healthcare laws, you may see the results of your imaging and laboratory studies on MyChart before your provider has had a chance to review them.  We understand that in some cases there may be results that are confusing or concerning to you. Not all laboratory results come back in the same time frame and the provider may be waiting for multiple results in order to interpret others.  Please give [place] 48 hours in order for your provider to thoroughly review all the results before contacting the office for clarification of your results.    Thank you for trusting me with your gastrointestinal care!   Alcide Evener, CRNP

## 2023-08-12 LAB — TISSUE TRANSGLUTAMINASE ABS,IGG,IGA
(tTG) Ab, IgA: <1 U/mL
(tTG) Ab, IgG: <1 U/mL

## 2023-08-12 LAB — IGA: Immunoglobulin A: 182 mg/dL (ref 47–310)

## 2023-08-29 ENCOUNTER — Ambulatory Visit (HOSPITAL_COMMUNITY)
Admission: RE | Admit: 2023-08-29 | Discharge: 2023-08-29 | Disposition: A | Discharge status: Home / Self Care | Payer: BLUE CROSS/BLUE SHIELD | Source: Ambulatory Visit | Attending: Nurse Practitioner | Admitting: Nurse Practitioner

## 2023-08-29 DIAGNOSIS — R1013 Epigastric pain: Secondary | ICD-10-CM | POA: Diagnosis present

## 2023-08-29 DIAGNOSIS — Z800 Family history of malignant neoplasm of digestive organs: Secondary | ICD-10-CM | POA: Diagnosis present

## 2023-08-29 DIAGNOSIS — K529 Noninfective gastroenteritis and colitis, unspecified: Secondary | ICD-10-CM | POA: Diagnosis present

## 2023-08-29 DIAGNOSIS — R112 Nausea with vomiting, unspecified: Secondary | ICD-10-CM | POA: Insufficient documentation

## 2023-08-30 ENCOUNTER — Telehealth: Payer: Self-pay | Admitting: Pharmacy Technician

## 2023-08-30 ENCOUNTER — Other Ambulatory Visit (HOSPITAL_COMMUNITY): Payer: Self-pay

## 2023-08-30 NOTE — Telephone Encounter (Signed)
,  Pharmacy Patient Advocate Encounter   Received notification from CoverMyMeds that prior authorization for ESOMEPRAZOLE 20MG  is required/requested.   Insurance verification completed.   The patient is insured through [employer] .   Per test claim: PA required; PA submitted to [employer] via CoverMyMeds Key/confirmation #/[facility] Status is pending   Pt has tried Omeprazole, and Lansoprazole OTC

## 2023-09-08 ENCOUNTER — Other Ambulatory Visit (HOSPITAL_COMMUNITY): Payer: Self-pay

## 2023-09-08 NOTE — Telephone Encounter (Signed)
Pharmacy Patient Advocate Encounter  Received notification from [facility] that Prior Authorization for Esomeprazole Magnesium 20MG  dr capsules has been APPROVED from 08/30/2023 to 08/29/2024. Ran test claim, Copay is $7.29. This test claim was processed through [facility]- copay amounts may vary at other pharmacies due to pharmacy/plan contracts, or as the patient moves through the different stages of their insurance plan.   PA #/Case ID/Reference #: [id number]

## 2023-11-04 ENCOUNTER — Encounter: Payer: Self-pay | Admitting: Internal Medicine

## 2023-11-07 ENCOUNTER — Encounter: Payer: BLUE CROSS/BLUE SHIELD | Admitting: Internal Medicine

## 2023-12-29 ENCOUNTER — Encounter: Payer: Self-pay | Admitting: Internal Medicine

## 2023-12-29 ENCOUNTER — Ambulatory Visit (AMBULATORY_SURGERY_CENTER): Payer: BLUE CROSS/BLUE SHIELD

## 2023-12-29 ENCOUNTER — Telehealth: Payer: Self-pay

## 2023-12-29 VITALS — Ht 60.0 in | Wt 168.0 lb

## 2023-12-29 DIAGNOSIS — Z800 Family history of malignant neoplasm of digestive organs: Secondary | ICD-10-CM

## 2023-12-29 DIAGNOSIS — K2100 Gastro-esophageal reflux disease with esophagitis, without bleeding: Secondary | ICD-10-CM

## 2023-12-29 MED ORDER — ONDANSETRON HCL 4 MG PO TABS
4.0000 mg | ORAL_TABLET | ORAL | 0 refills | Status: DC
Start: 2023-12-29 — End: 2024-01-02

## 2023-12-29 MED ORDER — SUTAB 1479-225-188 MG PO TABS
12.0000 | ORAL_TABLET | ORAL | 0 refills | Status: DC
Start: 2023-12-29 — End: 2024-01-02

## 2023-12-29 NOTE — Telephone Encounter (Signed)
 Call for pre visit, no answer and no voicemail available.  Will try back in 5 min.

## 2023-12-29 NOTE — Progress Notes (Signed)

## 2023-12-29 NOTE — Addendum Note (Signed)
 Addended by: Jaquelyn Bitter on: 12/29/2023 08:49 AM   Modules accepted: Orders

## 2023-12-29 NOTE — Telephone Encounter (Signed)
 Completed pre vist

## 2024-01-02 ENCOUNTER — Encounter: Payer: Self-pay | Admitting: Internal Medicine

## 2024-01-02 ENCOUNTER — Ambulatory Visit (AMBULATORY_SURGERY_CENTER): Payer: BLUE CROSS/BLUE SHIELD | Admitting: Internal Medicine

## 2024-01-02 VITALS — BP 132/90 | HR 62 | Temp 98.1°F | Resp 15 | Ht 60.0 in | Wt 168.0 lb

## 2024-01-02 DIAGNOSIS — Z1211 Encounter for screening for malignant neoplasm of colon: Secondary | ICD-10-CM

## 2024-01-02 DIAGNOSIS — K2980 Duodenitis without bleeding: Secondary | ICD-10-CM

## 2024-01-02 DIAGNOSIS — R112 Nausea with vomiting, unspecified: Secondary | ICD-10-CM

## 2024-01-02 DIAGNOSIS — K2970 Gastritis, unspecified, without bleeding: Secondary | ICD-10-CM

## 2024-01-02 DIAGNOSIS — Z800 Family history of malignant neoplasm of digestive organs: Secondary | ICD-10-CM | POA: Diagnosis not present

## 2024-01-02 DIAGNOSIS — K3189 Other diseases of stomach and duodenum: Secondary | ICD-10-CM

## 2024-01-02 DIAGNOSIS — K2100 Gastro-esophageal reflux disease with esophagitis, without bleeding: Secondary | ICD-10-CM

## 2024-01-02 DIAGNOSIS — K229 Disease of esophagus, unspecified: Secondary | ICD-10-CM

## 2024-01-02 MED ORDER — SODIUM CHLORIDE 0.9 % IV SOLN: 500.0000 mL | INTRAVENOUS | Status: DC

## 2024-01-02 NOTE — Patient Instructions (Signed)
-  Handout on gastritis  provided -await pathology results -repeat colonoscopy in 5 years for surveillance recommended.    -Continue present medications .  YOU HAD AN ENDOSCOPIC PROCEDURE TODAY AT THE [facility]:   Refer to the procedure report that was given to you for any specific questions about what was found during the examination.  If the procedure report does not answer your questions, please call your gastroenterologist to clarify.  If you requested that your care partner not be given the details of your procedure findings, then the procedure report has been included in a sealed envelope for you to review at your convenience later.  YOU SHOULD EXPECT: Some feelings of bloating in the abdomen. Passage of more gas than usual.  Walking can help get rid of the air that was put into your GI tract during the procedure and reduce the bloating. If you had a lower endoscopy (such as a colonoscopy or flexible sigmoidoscopy) you may notice spotting of blood in your stool or on the toilet paper. If you underwent a bowel prep for your procedure, you may not have a normal bowel movement for a few days.  Please Note:  You might notice some irritation and congestion in your nose or some drainage.  This is from the oxygen used during your procedure.  There is no need for concern and it should clear up in a day or so.  SYMPTOMS TO REPORT IMMEDIATELY:  Following lower endoscopy (colonoscopy or flexible sigmoidoscopy):  Excessive amounts of blood in the stool  Significant tenderness or worsening of abdominal pains  Swelling of the abdomen that is new, acute  Fever of 100F or higher  Following upper endoscopy (EGD)  Vomiting of blood or coffee ground material  New chest pain or pain under the shoulder blades  Painful or persistently difficult swallowing  New shortness of breath  Fever of 100F or higher  Black, tarry-looking stools  For urgent or emergent issues, a gastroenterologist can  be reached at any hour by calling [phone]. Do not use MyChart messaging for urgent concerns.    DIET:  We do recommend a small meal at first, but then you may proceed to your regular diet.  Drink plenty of fluids but you should avoid alcoholic beverages for 24 hours.  ACTIVITY:  You should plan to take it easy for the rest of today and you should NOT DRIVE or use heavy machinery until tomorrow (because of the sedation medicines used during the test).    FOLLOW UP: Our staff will call the number listed on your records the next business day following your procedure.  We will call around 7:15- 8:00 am to check on you and address any questions or concerns that you may have regarding the information given to you following your procedure. If we do not reach you, we will leave a message.     If any biopsies were taken you will be contacted by phone or by letter within the next 1-3 weeks.  Please call us at [phone] if you have not heard about the biopsies in 3 weeks.    SIGNATURES/CONFIDENTIALITY: You and/or your care partner have signed paperwork which will be entered into your electronic medical record.  These signatures attest to the fact that that the information above on your After Visit Summary has been reviewed and is understood.  Full responsibility of the confidentiality of this discharge information lies with you and/or your care-partner.

## 2024-01-02 NOTE — Progress Notes (Signed)
 Called to room to assist during endoscopic procedure.  Patient ID and intended procedure confirmed with present staff. Received instructions for my participation in the procedure from the performing physician.

## 2024-01-02 NOTE — Op Note (Signed)
 [facility] Patient Name: Carolyn Parker Procedure Date: 01/02/2024 8:36 AM MRN: [id number] Endoscopist: Beverley Fiedler , MD, [id number] Age: 43 Referring MD:  Date of Birth: 1981-05-16 Gender: Female Account #: [id number] Procedure:                Upper GI endoscopy Indications:              Nausea with vomiting, Nexium 20 mg daily on                            medication list currently Medicines:                Monitored Anesthesia Care Procedure:                Pre-Anesthesia Assessment:                           - Prior to the procedure, a History and Physical                            was performed, and patient medications and                            allergies were reviewed. The patient's tolerance of                            previous anesthesia was also reviewed. The risks                            and benefits of the procedure and the sedation                            options and risks were discussed with the patient.                            All questions were answered, and informed consent                            was obtained. Prior Anticoagulants: The patient has                            taken no anticoagulant or antiplatelet agents. ASA                            Grade Assessment: II - A patient with mild systemic                            disease. After reviewing the risks and benefits,                            the patient was deemed in satisfactory condition to                            undergo the procedure.  After obtaining informed consent, the endoscope was                            passed under direct vision. Throughout the                            procedure, the patient's blood pressure, pulse, and                            oxygen saturations were monitored continuously. The                            GIF [id number] #[id number] was introduced through the                            mouth, and advanced to the second part  of duodenum.                            The upper GI endoscopy was accomplished without                            difficulty. The patient tolerated the procedure                            well. Scope In: Scope Out: Findings:                 The Z-line was irregular and was found 38 cm from                            the incisors. One 1 cm tongue on salmon-colored                            mucosa. Biopsies were taken with a cold forceps for                            histology.                           Moderate inflammation characterized by congestion                            (edema), erosions and erythema was found in the                            gastric body and in the gastric antrum. Biopsies                            were taken with a cold forceps for Helicobacter                            pylori testing.                           Mild inflammation characterized by erythema was  found in the duodenal bulb.                           The second portion of the duodenum was normal.                            Biopsies for histology were taken with a cold                            forceps for evaluation of celiac disease. Complications:            No immediate complications. Estimated Blood Loss:     Estimated blood loss was minimal. Impression:               - Z-line irregular, 38 cm from the incisors.                            Biopsied to exclude Barrett's esophagus.                           - Gastritis. Biopsied.                           - Bulbar duodenitis.                           - Normal second portion of the duodenum. Biopsied. Recommendation:           - Patient has a contact number available for                            emergencies. The signs and symptoms of potential                            delayed complications were discussed with the                            patient. Return to normal activities tomorrow.                             Written discharge instructions were provided to the                            patient.                           - Resume previous diet.                           - Continue present medications.                           - Await pathology results. Beverley Fiedler, MD 01/02/2024 9:18:04 AM This report has been signed electronically.

## 2024-01-02 NOTE — Progress Notes (Signed)
 To pacu, VSS. Report to RN.tb

## 2024-01-02 NOTE — Progress Notes (Signed)
 GASTROENTEROLOGY PROCEDURE H&P NOTE   Primary Care Physician: Joana Reamer, DO    Reason for Procedure:   Fam hx colon cancer, chronic nausea and vomiting  Plan:    EGD/colon  Patient is appropriate for endoscopic procedure(s) in the ambulatory ([facility]) setting.  The nature of the procedure, as well as the risks, benefits, and alternatives were carefully and thoroughly reviewed with the patient. Ample time for discussion and questions allowed. The patient understood, was satisfied, and agreed to proceed.     HPI: Carolyn Parker is a 43 y.o. female who presents for EGD/colon.  Medical history as below.  Tolerated the prep.  No recent chest pain or shortness of breath.  No abdominal pain today.  Past Medical History:  Diagnosis Date   Allergy    Anemia    Anxiety    Chronic post-traumatic stress disorder (PTSD)    followed by dr Garnetta Buddy   Complication of anesthesia    hard to wake   Constipation    Family history of breast cancer    Genetic testing 12/2013   My Risk neg    GERD (gastroesophageal reflux disease)    History of kidney stones    Hydronephrosis, left    Hypertension    Irritable bowel syndrome with diarrhea    Kidney stone    Left ureteral stone    Major depressive disorder    followed by dr Garnetta Buddy [facility])   Mild asthma     Past Surgical History:  Procedure Laterality Date   COLONOSCOPY  10-06-2017  dr Rhea Belton   CYSTOSCOPY WITH RETROGRADE PYELOGRAM, URETEROSCOPY AND STENT PLACEMENT Left 12/27/2017   Procedure: CYSTOSCOPY WITH RETROGRADE PYELOGRAM, URETEROSCOPY AND STENT PLACEMENT;  Surgeon: Sebastian Ache, MD;  Location: [facility];  Service: Urology;  Laterality: Left;   CYSTOSCOPY/URETEROSCOPY/HOLMIUM LASER/STENT PLACEMENT Left 01/17/2018   Procedure: CYSTOSCOPY/URETEROSCOPY/STENT EXCHANGE;  Surgeon: Jerilee Field, MD;  Location: [facility];  Service: Urology;  Laterality: Left;   ESSURE TUBAL LIGATION Bilateral 2012    EXTRACORPOREAL SHOCK WAVE LITHOTRIPSY Left 12/26/2017   Procedure: LEFT EXTRACORPOREAL SHOCK WAVE LITHOTRIPSY (ESWL);  Surgeon: Hildred Laser, MD;  Location: [facility];  Service: Urology;  Laterality: Left;   LAPAROSCOPIC ENDOMETRIOSIS FULGURATION  2006    Prior to Admission medications   Medication Sig Start Date End Date Taking? Authorizing Provider  acetaminophen (ACETAMINOPHEN 8 HOUR) 650 MG CR tablet  04/06/19  Yes [provider]  amoxicillin-clavulanate (AUGMENTIN) 875-125 MG tablet 1 tablet Orally every 12 hrs for 10 day(s)   Yes [provider]  ARIPiprazole (ABILIFY) 2 MG tablet Take 0.5 tablets by mouth every morning.   Yes [provider]  FLUoxetine (PROZAC) 10 MG capsule Take 10 mg by mouth daily. 06/06/23  Yes [provider]  gabapentin (NEURONTIN) 300 MG capsule  06/17/23  Yes [provider]  ibuprofen (ADVIL) 200 MG tablet 1 tablet with food or milk as needed Orally Three times a day   Yes [provider]  metFORMIN (GLUCOPHAGE-XR) 500 MG 24 hr tablet TAKE 1 TABLET BY MOUTH ONCE DAILY WITH EVENING MEAL FOR 90 DAYS   Yes [provider]  ondansetron (ZOFRAN-ODT) 8 MG disintegrating tablet Take by mouth. 05/03/21  Yes [provider]  oxybutynin (DITROPAN-XL) 5 MG 24 hr tablet Take 5 mg by mouth daily.   Yes [provider]  predniSONE (DELTASONE) 20 MG tablet 3 tablets today then 1 tablet Orally twice a day for 5 days 12/28/23  Yes [provider]  pseudoephedrine (SUDAFED) 60 MG tablet 1 tablet as needed Orally every 6 hrs for 5 days 12/28/23  Yes [provider]  albuterol (ACCUNEB) 0.63 MG/3ML nebulizer solution Albuterol Sulfate    [provider]  esomeprazole (NEXIUM) 20 MG capsule Take 1 capsule (20 mg total) by mouth daily. Take 30 minutes before breakfast. Patient not taking: Reported on 01/02/2024 08/11/23   Arnaldo Natal, NP    Current Outpatient  Medications  Medication Sig Dispense Refill   acetaminophen (ACETAMINOPHEN 8 HOUR) 650 MG CR tablet      amoxicillin-clavulanate (AUGMENTIN) 875-125 MG tablet 1 tablet Orally every 12 hrs for 10 day(s)     ARIPiprazole (ABILIFY) 2 MG tablet Take 0.5 tablets by mouth every morning.     FLUoxetine (PROZAC) 10 MG capsule Take 10 mg by mouth daily.     gabapentin (NEURONTIN) 300 MG capsule      ibuprofen (ADVIL) 200 MG tablet 1 tablet with food or milk as needed Orally Three times a day     metFORMIN (GLUCOPHAGE-XR) 500 MG 24 hr tablet TAKE 1 TABLET BY MOUTH ONCE DAILY WITH EVENING MEAL FOR 90 DAYS     ondansetron (ZOFRAN-ODT) 8 MG disintegrating tablet Take by mouth.     oxybutynin (DITROPAN-XL) 5 MG 24 hr tablet Take 5 mg by mouth daily.     predniSONE (DELTASONE) 20 MG tablet 3 tablets today then 1 tablet Orally twice a day for 5 days     pseudoephedrine (SUDAFED) 60 MG tablet 1 tablet as needed Orally every 6 hrs for 5 days     albuterol (ACCUNEB) 0.63 MG/3ML nebulizer solution Albuterol Sulfate     esomeprazole (NEXIUM) 20 MG capsule Take 1 capsule (20 mg total) by mouth daily. Take 30 minutes before breakfast. (Patient not taking: Reported on 01/02/2024) 30 capsule 1   Current Facility-Administered Medications  Medication Dose Route Frequency Provider Last Rate Last Admin   0.9 %  sodium chloride infusion  500 mL Intravenous Continuous Keshav Winegar, Carie Caddy, MD        Allergies as of 01/02/2024 - Review Complete 01/02/2024  Allergen Reaction Noted   Bee venom Anaphylaxis    Sertraline Other (See Comments) 05/20/2016   Sulfa antibiotics Anaphylaxis and Other (See Comments) 01/05/2015   Alpha-gal Other (See Comments) 12/29/2023   Beef-derived drug products Diarrhea and Nausea And Vomiting 12/22/2017   Dairy aid [tilactase] Diarrhea and Nausea And Vomiting 12/22/2017   Pork-derived products Diarrhea and Nausea And Vomiting 12/22/2017   Shellfish allergy Diarrhea and Nausea And Vomiting 12/22/2017    Tramadol hcl Other (See Comments) 12/29/2023   Amoxicillin-pot clavulanate Nausea And Vomiting 05/20/2016   Peanut-containing drug products Nausea And Vomiting 12/29/2023   Wheat Diarrhea and Nausea And Vomiting 12/22/2017    Family History  Problem Relation Age of Onset   Colon polyps Mother    Anxiety disorder Mother    Depression Mother    Diabetes Mother    Heart Problems Mother    Colon cancer Mother 57   Hypertension Father    Bipolar disorder Sister    Breast cancer Maternal Aunt 30   Colon polyps Maternal Grandmother    Colon cancer Maternal Grandmother 26   Esophageal cancer Neg Hx    Rectal cancer Neg Hx    Stomach cancer Neg Hx     Social History   Socioeconomic History   Marital status: Legally Separated    Spouse name: Not on file  Number of children: 2   Years of education: Not on file   Highest education level: Not on file  Occupational History   Occupation: student  Tobacco Use   Smoking status: Former    Current packs/day: 0.00    Types: Cigarettes    Start date: 05/20/2000    Quit date: 05/20/2010    Years since quitting: 13.6   Smokeless tobacco: Never  Vaping Use   Vaping status: Never Used  Substance and Sexual Activity   Alcohol use: Yes    Alcohol/week: 0.0 standard drinks of alcohol    Comment: socially   Drug use: Yes    Frequency: 7.0 times per week    Types: Marijuana    Comment: uses daily- last used 12-31-23   Sexual activity: Yes    Partners: Male    Birth control/protection: Surgical    Comment: Essure Bilateral Tubal Ligation 2012  Other Topics Concern   Not on file  Social History Narrative   Not on file   Social Drivers of Health   Financial Resource Strain: Not on file  Food Insecurity: Not on file  Transportation Needs: Not on file  Physical Activity: Not on file  Stress: Not on file  Social Connections: Not on file  Intimate Partner Violence: Not on file    Physical Exam: Vital signs in last 24 hours: @BP   122/66   Pulse 64   Temp 98.1 F (36.7 C)   Ht 5' (1.524 m)   Wt 168 lb (76.2 kg)   LMP 12/29/2023   SpO2 96%   BMI 32.81 kg/m  GEN: NAD EYE: Sclerae anicteric ENT: MMM CV: Non-tachycardic Pulm: CTA b/l GI: Soft, NT/ND NEURO:  Alert & Oriented x 3   Erick Blinks, MD [facility]  01/02/2024 8:32 AM

## 2024-01-02 NOTE — Progress Notes (Signed)
Pt's states no medical or surgical changes since previsit or office visit.Pt's states no medical or surgical changes since previsit or office visit. 

## 2024-01-02 NOTE — Op Note (Signed)
 [facility] Patient Name: Carolyn Parker Procedure Date: 01/02/2024 8:26 AM MRN: [id number] Endoscopist: Beverley Fiedler , MD, [id number] Age: 43 Referring MD:  Date of Birth: Apr 27, 1981 Gender: Female Account #: [id number] Procedure:                Colonoscopy Indications:              Screening in patient at increased risk: Family                            history of 1st-degree relative with colorectal                            cancer, Last colonoscopy: December 2018 Medicines:                Monitored Anesthesia Care Procedure:                Pre-Anesthesia Assessment:                           - Prior to the procedure, a History and Physical                            was performed, and patient medications and                            allergies were reviewed. The patient's tolerance of                            previous anesthesia was also reviewed. The risks                            and benefits of the procedure and the sedation                            options and risks were discussed with the patient.                            All questions were answered, and informed consent                            was obtained. Prior Anticoagulants: The patient has                            taken no anticoagulant or antiplatelet agents. ASA                            Grade Assessment: II - A patient with mild systemic                            disease. After reviewing the risks and benefits,                            the patient was deemed in satisfactory condition to  undergo the procedure.                           After obtaining informed consent, the colonoscope                            was passed under direct vision. Throughout the                            procedure, the patient's blood pressure, pulse, and                            oxygen saturations were monitored continuously. The                            CF [id number] #[id number] was  introduced through the anus                            and advanced to the terminal ileum. The colonoscopy                            was performed without difficulty. The patient                            tolerated the procedure well. The quality of the                            bowel preparation was good. The terminal ileum,                            ileocecal valve, appendiceal orifice, and rectum                            were photographed. Scope In: 8:57:59 AM Scope Out: 9:08:08 AM Scope Withdrawal Time: 0 hours 7 minutes 37 seconds  Total Procedure Duration: 0 hours 10 minutes 9 seconds  Findings:                 The digital rectal exam was normal.                           The terminal ileum appeared normal.                           The entire examined colon appeared normal on direct                            and retroflexion views. Complications:            No immediate complications. Estimated Blood Loss:     Estimated blood loss: none. Impression:               - The examined portion of the ileum was normal.                           - The entire examined colon is normal  on direct and                            retroflexion views.                           - No specimens collected. Recommendation:           - Patient has a contact number available for                            emergencies. The signs and symptoms of potential                            delayed complications were discussed with the                            patient. Return to normal activities tomorrow.                            Written discharge instructions were provided to the                            patient.                           - Resume previous diet.                           - Continue present medications.                           - Repeat colonoscopy in 5 years for screening                            purposes. Beverley Fiedler, MD 01/02/2024 9:19:49 AM This report has been signed  electronically.

## 2024-01-03 ENCOUNTER — Telehealth: Payer: Self-pay

## 2024-01-03 NOTE — Telephone Encounter (Signed)
 Left message on follow up call.

## 2024-01-04 ENCOUNTER — Encounter: Payer: Self-pay | Admitting: Internal Medicine

## 2024-01-04 LAB — SURGICAL PATHOLOGY

## 2024-08-14 ENCOUNTER — Other Ambulatory Visit: Payer: Self-pay | Admitting: Medical Genetics

## 2024-08-14 DIAGNOSIS — Z006 Encounter for examination for normal comparison and control in clinical research program: Secondary | ICD-10-CM
